# Patient Record
Sex: Female | Born: 1955 | Race: White | Hispanic: No | Marital: Married | Smoking: Former smoker
Health system: Southern US, Community
[De-identification: ages and names within clinical notes are randomized; demographics above are authoritative.]

## PROBLEM LIST (undated history)

## (undated) DIAGNOSIS — Z8639 Personal history of other endocrine, nutritional and metabolic disease: Secondary | ICD-10-CM

## (undated) DIAGNOSIS — F419 Anxiety disorder, unspecified: Secondary | ICD-10-CM

## (undated) DIAGNOSIS — E785 Hyperlipidemia, unspecified: Secondary | ICD-10-CM

## (undated) DIAGNOSIS — Z8614 Personal history of Methicillin resistant Staphylococcus aureus infection: Secondary | ICD-10-CM

## (undated) DIAGNOSIS — K219 Gastro-esophageal reflux disease without esophagitis: Secondary | ICD-10-CM

## (undated) DIAGNOSIS — R911 Solitary pulmonary nodule: Secondary | ICD-10-CM

## (undated) DIAGNOSIS — M1990 Unspecified osteoarthritis, unspecified site: Secondary | ICD-10-CM

## (undated) DIAGNOSIS — K449 Diaphragmatic hernia without obstruction or gangrene: Secondary | ICD-10-CM

## (undated) DIAGNOSIS — K8020 Calculus of gallbladder without cholecystitis without obstruction: Secondary | ICD-10-CM

## (undated) HISTORY — DX: Gastro-esophageal reflux disease without esophagitis: K21.9

## (undated) HISTORY — PX: UPPER GASTROINTESTINAL ENDOSCOPY: SHX188

## (undated) HISTORY — DX: Hyperlipidemia, unspecified: E78.5

## (undated) HISTORY — DX: Solitary pulmonary nodule: R91.1

## (undated) HISTORY — PX: TONSILLECTOMY: SUR1361

## (undated) HISTORY — DX: Anxiety disorder, unspecified: F41.9

## (undated) HISTORY — DX: Calculus of gallbladder without cholecystitis without obstruction: K80.20

## (undated) HISTORY — PX: COLONOSCOPY: SHX174

## (undated) HISTORY — DX: Personal history of Methicillin resistant Staphylococcus aureus infection: Z86.14

## (undated) HISTORY — DX: Personal history of other endocrine, nutritional and metabolic disease: Z86.39

## (undated) HISTORY — DX: Diaphragmatic hernia without obstruction or gangrene: K44.9

## (undated) HISTORY — DX: Unspecified osteoarthritis, unspecified site: M19.90

---

## 1992-08-17 HISTORY — PX: BREAST EXCISIONAL BIOPSY: SUR124

## 2005-08-17 DIAGNOSIS — Z8614 Personal history of Methicillin resistant Staphylococcus aureus infection: Secondary | ICD-10-CM

## 2005-08-17 HISTORY — DX: Personal history of Methicillin resistant Staphylococcus aureus infection: Z86.14

## 2008-08-17 DIAGNOSIS — Z8639 Personal history of other endocrine, nutritional and metabolic disease: Secondary | ICD-10-CM

## 2008-08-17 DIAGNOSIS — K449 Diaphragmatic hernia without obstruction or gangrene: Secondary | ICD-10-CM

## 2008-08-17 HISTORY — DX: Diaphragmatic hernia without obstruction or gangrene: K44.9

## 2008-08-17 HISTORY — DX: Personal history of other endocrine, nutritional and metabolic disease: Z86.39

## 2012-10-03 ENCOUNTER — Encounter: Payer: Self-pay | Admitting: Family Medicine

## 2012-10-03 ENCOUNTER — Ambulatory Visit (INDEPENDENT_AMBULATORY_CARE_PROVIDER_SITE_OTHER): Payer: BC Managed Care – PPO | Admitting: Family Medicine

## 2012-10-03 VITALS — BP 140/90 | HR 76 | Temp 97.9°F | Ht 66.0 in | Wt 207.0 lb

## 2012-10-03 DIAGNOSIS — R911 Solitary pulmonary nodule: Secondary | ICD-10-CM

## 2012-10-03 DIAGNOSIS — E559 Vitamin D deficiency, unspecified: Secondary | ICD-10-CM

## 2012-10-03 DIAGNOSIS — K8020 Calculus of gallbladder without cholecystitis without obstruction: Secondary | ICD-10-CM | POA: Insufficient documentation

## 2012-10-03 DIAGNOSIS — L989 Disorder of the skin and subcutaneous tissue, unspecified: Secondary | ICD-10-CM

## 2012-10-03 DIAGNOSIS — F411 Generalized anxiety disorder: Secondary | ICD-10-CM

## 2012-10-03 DIAGNOSIS — B351 Tinea unguium: Secondary | ICD-10-CM

## 2012-10-03 DIAGNOSIS — F419 Anxiety disorder, unspecified: Secondary | ICD-10-CM

## 2012-10-03 DIAGNOSIS — K219 Gastro-esophageal reflux disease without esophagitis: Secondary | ICD-10-CM

## 2012-10-03 LAB — COMPREHENSIVE METABOLIC PANEL
ALT: 24 U/L (ref 0–35)
AST: 21 U/L (ref 0–37)
Alkaline Phosphatase: 40 U/L (ref 39–117)
Calcium: 9.2 mg/dL (ref 8.4–10.5)
Chloride: 104 mEq/L (ref 96–112)
Creatinine, Ser: 0.9 mg/dL (ref 0.4–1.2)
Total Bilirubin: 0.3 mg/dL (ref 0.3–1.2)

## 2012-10-03 MED ORDER — TERBINAFINE HCL 250 MG PO TABS: ORAL_TABLET | ORAL | Status: DC

## 2012-10-03 NOTE — Patient Instructions (Addendum)
Nice to meet you. Please stop by to see Shirlee Limerick on your way out to set up your dermatology and pulmonary appointments.  Please come see me in May for your complete physical.  Please start lamisil 250 mg tab- tablet daily for 6 weeks.  Please call in 6 weeks with an update.  We will call you with your lab results from today.

## 2012-10-03 NOTE — Progress Notes (Signed)
Subjective:    Patient ID: Monica Carrillo, female    DOB: 04/09/1956, 57 y.o.   MRN: [id number]  HPI  Very pleasant 57 yo female here to establish care.  Recently relocated here from [place] to be closer to her son and 2 grandchildren.  Husband retired.  GERD- on Nexium 40 mg daily.  Brings in previous records.  In 2008- UGI series showed GERD with hiatal hernia. Feels her symptoms are pretty well controlled with Nexium.  Toe nail fungus- Right great toe- discoloration and cracking for months.  Failed topical antifungals.  Has had this on prior toe in past.  Required oral antifungals.  Skin lesion- raised pink lesion on left cheek.  Been there for months.  Not sure if has grown.  Has not yet established with dermatologist here.  Anxiety- has been quite well controlled on Cymbalta 60 mg daily, even with recent move.  Denies any anxiety or depression.  Pulmonary nodules- incidental findings on CXR in 2010.  Had serial follow up Chest CTs to ensure stability.  Per pt, pulmonologist in [place] advised follow up with pulmonologist (not CT scan necessarily) in 05/2013.  Had last CPX in 12/2011.  UTD on prevention.  Patient Active Problem List  Diagnosis  . Anxiety  . Gallstones  . GERD (gastroesophageal reflux disease)  . Unspecified vitamin D deficiency  . Lung nodule   Past Medical History  Diagnosis Date  . Anxiety   . Gallstones   . GERD (gastroesophageal reflux disease)   . History of thyroid nodule 2010  . History of MRSA infection 2007  . Hiatal hernia 2010  . Lung nodule     needs follow up in 05/2013   Past Surgical History  Procedure Laterality Date  . Cesarean section    . Tonsillectomy     History  Substance Use Topics  . Smoking status: Former Games developer  . Smokeless tobacco: Not on file  . Alcohol Use: Not on file   Family History  Problem Relation Age of Onset  . Hypertension Mother   . Kidney disease Brother    No Known Allergies No current  outpatient prescriptions on file prior to visit.   No current facility-administered medications on file prior to visit.   The PMH, PSH, Social History, Family History, Medications, and allergies have been reviewed in [facility], and have been updated if relevant.    Review of Systems    See HPI No CP or SOB Objective:   Physical Exam  Constitutional: She is oriented to person, place, and time. She appears well-developed and well-nourished. No distress.  HENT:  Head: Normocephalic and atraumatic.  Right Ear: External ear normal.  Eyes: Pupils are equal, round, and reactive to light.  Neck: Normal range of motion. Neck supple.  Cardiovascular: Normal rate, regular rhythm and intact distal pulses.   Pulmonary/Chest: Effort normal and breath sounds normal.  Abdominal: Soft.  Neurological: She is alert and oriented to person, place, and time.  Skin:     Psychiatric: She has a normal mood and affect. Her speech is normal and behavior is normal. Judgment and thought content normal. Cognition and memory are normal.      Assessment & Plan:  1. Skin lesion Possibly pre cancerous lesion.  She would like refer rather than cryo since she was following up yearly with dermatology in [place].  Referral placed. - Ambulatory referral to Dermatology  2. Unspecified vitamin D deficiency Has been taking high dose vitamin D for several months to  a year.  Recheck Vit D today. Can likely D/c this and start 800- 1000 IU of Vit D daily. - Vitamin D, 25-hydroxy  3. Nail fungal infection Failed topical antifungals.  Will start oral lamisil 250 mg daily x 6-12 weeks.  Check LFTs today. - Comprehensive metabolic panel  4. Lung nodule No growth in a non smoker.  She would like to get established with pulmonary since her previous pulmonary doctor recommended this.  Referral placed. - Ambulatory referral to Pulmonology  5. GERD (gastroesophageal reflux disease) Quiet on Nexium.  6. Anxiety Symptoms  stable on current dose of Cymbalta.

## 2012-10-04 LAB — VITAMIN D 25 HYDROXY (VIT D DEFICIENCY, FRACTURES): Vit D, 25-Hydroxy: 52 ng/mL (ref 30–89)

## 2012-10-07 ENCOUNTER — Encounter: Payer: Self-pay | Admitting: Family Medicine

## 2012-10-11 ENCOUNTER — Ambulatory Visit (INDEPENDENT_AMBULATORY_CARE_PROVIDER_SITE_OTHER): Payer: BC Managed Care – PPO | Admitting: Pulmonary Disease

## 2012-10-11 ENCOUNTER — Encounter: Payer: Self-pay | Admitting: Pulmonary Disease

## 2012-10-11 VITALS — BP 118/70 | HR 82 | Temp 98.1°F | Ht 66.0 in | Wt 208.0 lb

## 2012-10-11 DIAGNOSIS — R911 Solitary pulmonary nodule: Secondary | ICD-10-CM

## 2012-10-11 NOTE — Assessment & Plan Note (Signed)
Monica Carrillo has had scarring in her right middle lobe noticed since July 2010. I am able to see the images of the CT scan performed in October and July of 2013 but I am unable to see the official reports. The right middle lobe lesion did not change during that interval and she tells me that her previous pulmonologist did not see interval change from 2012 3 2013.  I think that it is very likely that the right middle lobe lesion represents scarring from an old infection as this is a common site for such problems. She has no personal risk factors for lung cancer and was lack of growth in this lesion is reassuring.  Plan: -I agree with her previous pulmonologist that we should repeat a CT scan of her chest without contrast in October 2014. If no change noted then she does not need further imaging.

## 2012-10-11 NOTE — Progress Notes (Signed)
Subjective:    Patient ID: Monica Carrillo, female    DOB: 10/10/1955, 57 y.o.   MRN: [id number]  HPI  This is a very pleasant 57 year old female who just relocated to [facility] who comes to our clinic today for evaluation of a pulmonary nodule. She smoked very briefly as a teenager on weekends only and has never had any respiratory illnesses. In 2010 she underwent a DEXA scan and was noted to have a right middle lobe nodule. Her primary care physician followed this nodule with serial CT scans and after 2 years there was slight growth and so she was referred to a pulmonologist. The pulmonologist felt that this lesion represented an old infection and treated her with 2 weeks of Augmentin. He then followed the lesion for 2 years with q6 month CT scans. According to her the lesion did not grow during that time and he instructed her that a repeat CT scan should be performed in October 2014. He planned to stop the serial CTs if the October 2014 lesion was unchanged. Of note, a July 2013 CT scan showed a small, subcentimeter nodule in the lingula. This lesion was not present on the October 2013 study. She tells me that she has no respiratory symptoms. She has no cough, shortness of breath, or chest tightness. She does not have a personal history of malignancy. She has no first-degree relatives with lung cancer. She does not recall ever being diagnosed with pneumonia.   Past Medical History  Diagnosis Date  . Anxiety   . Gallstones   . GERD (gastroesophageal reflux disease)   . History of thyroid nodule 2010  . History of MRSA infection 2007  . Hiatal hernia 2010  . Lung nodule     needs follow up in 05/2013     Family History  Problem Relation Age of Onset  . Hypertension Mother   . Kidney disease Brother      History   Social History  . Marital Status: Married    Spouse Name: N/A    Number of Children: N/A  . Years of Education: N/A   Occupational History  . Not on  file.   Social History Main Topics  . Smoking status: Never Smoker   . Smokeless tobacco: Not on file  . Alcohol Use: Not on file  . Drug Use: Not on file  . Sexually Active: Not on file   Other Topics Concern  . Not on file   Social History Narrative   Married.  Two children, 5 grandchildren.   Son, daughter in law and two grand daughters live in [place].      Recently moved to [place] from [place].   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     No Known Allergies   Outpatient Prescriptions Prior to Visit  Medication Sig Dispense Refill  . dicyclomine (BENTYL) 10 MG capsule Take 10 mg by mouth 4 (four) times daily -  before meals and at bedtime.      . DULoxetine (CYMBALTA) 60 MG capsule Take 60 mg by mouth daily.      . ergocalciferol (VITAMIN D2) 50000 UNITS capsule Take 50,000 Units by mouth once a week.      . esomeprazole (NEXIUM) 40 MG capsule Take 40 mg by mouth daily before breakfast.      . terbinafine (LAMISIL) 250 MG tablet 1 tablet once daily for 6 weeks.  30 tablet  6   No facility-administered medications prior  to visit.      Review of Systems  Constitutional: Negative for fever and unexpected weight change.  HENT: Negative for ear pain, nosebleeds, congestion, sore throat, rhinorrhea, sneezing, trouble swallowing, dental problem, postnasal drip and sinus pressure.   Eyes: Negative for redness and itching.  Respiratory: Positive for cough. Negative for chest tightness, shortness of breath and wheezing.   Cardiovascular: Negative for palpitations and leg swelling.  Gastrointestinal: Negative for nausea and vomiting.  Genitourinary: Negative for dysuria.  Musculoskeletal: Negative for joint swelling.  Skin: Negative for rash.  Neurological: Negative for headaches.  Hematological: Does not bruise/bleed easily.  Psychiatric/Behavioral: Positive for dysphoric mood. The patient is nervous/anxious.        Objective:   Physical  Exam  Filed Vitals:   10/11/12 1118  BP: 118/70  Pulse: 82  Temp: 98.1 F (36.7 C)  TempSrc: Oral  Height: 5\' 6"  (1.676 m)  Weight: 208 lb (94.348 kg)  SpO2: 97%   Gen: well appearing, no acute distress HEENT: NCAT, PERRL, EOMi, OP clear, neck supple without masses PULM: CTA B CV: RRR, no mgr, no JVD AB: BS+, soft, nontender, no hsm Ext: warm, no edema, no clubbing, no cyanosis Derm: no rash or skin breakdown Neuro: A&Ox4, CN II-XII intact, strength 5/5 in all 4 extremities  October 2013 CT chest personally reviewed by me (McQuaid)>> there is a triangular area of consolidation in the right middle lobe abutting the right heart border. It is approximately one and a half to 2 cm deep. There are no other airway, mediastinal, or parenchymal abnormalities compared to the July 2013 study the lesion has not changed. A July 2013 nodule in the lingula is no longer present.      Assessment & Plan:   Lung nodule Ms. Fass has had scarring in her right middle lobe noticed since July 2010. I am able to see the images of the CT scan performed in October and July of 2013 but I am unable to see the official reports. The right middle lobe lesion did not change during that interval and she tells me that her previous pulmonologist did not see interval change from 2012 3 2013.  I think that it is very likely that the right middle lobe lesion represents scarring from an old infection as this is a common site for such problems. She has no personal risk factors for lung cancer and was lack of growth in this lesion is reassuring.  Plan: -I agree with her previous pulmonologist that we should repeat a CT scan of her chest without contrast in October 2014. If no change noted then she does not need further imaging.   Updated Medication List Outpatient Encounter Prescriptions as of 10/11/2012  Medication Sig Dispense Refill  . dicyclomine (BENTYL) 10 MG capsule Take 10 mg by mouth 4 (four) times daily -   before meals and at bedtime.      . DULoxetine (CYMBALTA) 60 MG capsule Take 60 mg by mouth daily.      . ergocalciferol (VITAMIN D2) 50000 UNITS capsule Take 50,000 Units by mouth once a week.      . esomeprazole (NEXIUM) 40 MG capsule Take 40 mg by mouth daily before breakfast.      . terbinafine (LAMISIL) 250 MG tablet 1 tablet once daily for 6 weeks.  30 tablet  6   No facility-administered encounter medications on file as of 10/11/2012.

## 2012-10-11 NOTE — Patient Instructions (Signed)
We will set up a CT scan of your Chest in October 2014 at [facility].  If you have not heard from our office about this by late September call us.  We will see you after the CT in October.

## 2012-11-03 ENCOUNTER — Telehealth: Payer: Self-pay

## 2012-11-03 NOTE — Telephone Encounter (Signed)
Pt left v/m has samples of Cymbalta thru 11/05/12. Pt request more samples. Call when ready for pick up.

## 2012-11-03 NOTE — Telephone Encounter (Signed)
Ok to give samples

## 2012-11-04 NOTE — Telephone Encounter (Signed)
5 sample boxes of #7 each given.  Lot number [id number], EXP 09/2013.  Advised patient, she will pick up.

## 2012-11-16 ENCOUNTER — Telehealth: Payer: Self-pay

## 2012-11-16 MED ORDER — VALACYCLOVIR HCL 1 G PO TABS: 1000.0000 mg | ORAL_TABLET | Freq: Two times a day (BID) | ORAL | Status: DC

## 2012-11-16 NOTE — Telephone Encounter (Signed)
6 sample boxes of # 5 each given.  Lot number [id number], exp 02/2015.    Advised patient ok to pick up.  [facility]

## 2012-11-16 NOTE — Telephone Encounter (Signed)
Pt said Nexium is helping and request samples of Nexium if available. Please advise. Pt request rx for Valacyclovir 1 gm # 30 with instructions One tab by mouth twice a day until fever blister gone. Max 5 days per outbreak. Was given by former physician in [place]. [facility].Please advise.

## 2012-11-16 NOTE — Telephone Encounter (Signed)
Ok to give samples and send rx for valtrex as pt requests below.

## 2012-12-23 ENCOUNTER — Other Ambulatory Visit: Payer: Self-pay | Admitting: Family Medicine

## 2012-12-23 DIAGNOSIS — E559 Vitamin D deficiency, unspecified: Secondary | ICD-10-CM

## 2012-12-23 DIAGNOSIS — Z0000 Encounter for general adult medical examination without abnormal findings: Secondary | ICD-10-CM

## 2012-12-23 DIAGNOSIS — Z136 Encounter for screening for cardiovascular disorders: Secondary | ICD-10-CM

## 2012-12-27 ENCOUNTER — Other Ambulatory Visit: Payer: BC Managed Care – PPO

## 2013-01-03 ENCOUNTER — Encounter: Payer: BC Managed Care – PPO | Admitting: Family Medicine

## 2013-01-11 ENCOUNTER — Other Ambulatory Visit (INDEPENDENT_AMBULATORY_CARE_PROVIDER_SITE_OTHER): Payer: BC Managed Care – PPO

## 2013-01-11 DIAGNOSIS — Z136 Encounter for screening for cardiovascular disorders: Secondary | ICD-10-CM

## 2013-01-11 DIAGNOSIS — Z0000 Encounter for general adult medical examination without abnormal findings: Secondary | ICD-10-CM

## 2013-01-11 DIAGNOSIS — E559 Vitamin D deficiency, unspecified: Secondary | ICD-10-CM

## 2013-01-11 LAB — CBC WITH DIFFERENTIAL/PLATELET
Basophils Absolute: 0 10*3/uL (ref 0.0–0.1)
Eosinophils Relative: 3.9 % (ref 0.0–5.0)
Lymphocytes Relative: 31.3 % (ref 12.0–46.0)
Monocytes Relative: 9.7 % (ref 3.0–12.0)
Platelets: 299 10*3/uL (ref 150.0–400.0)
RDW: 13.1 % (ref 11.5–14.6)
WBC: 4.9 10*3/uL (ref 4.5–10.5)

## 2013-01-11 LAB — COMPREHENSIVE METABOLIC PANEL
ALT: 29 U/L (ref 0–35)
AST: 23 U/L (ref 0–37)
Albumin: 3.9 g/dL (ref 3.5–5.2)
Alkaline Phosphatase: 36 U/L — ABNORMAL LOW (ref 39–117)
BUN: 17 mg/dL (ref 6–23)
Calcium: 9 mg/dL (ref 8.4–10.5)
Chloride: 105 mEq/L (ref 96–112)
Creatinine, Ser: 0.9 mg/dL (ref 0.4–1.2)
Potassium: 4.2 mEq/L (ref 3.5–5.1)

## 2013-01-11 LAB — LDL CHOLESTEROL, DIRECT: Direct LDL: 175.6 mg/dL

## 2013-01-11 LAB — LIPID PANEL
HDL: 53.9 mg/dL (ref >39.00)
Total CHOL/HDL Ratio: 5
Triglycerides: 126 mg/dL (ref 0.0–149.0)

## 2013-01-12 LAB — VITAMIN D 25 HYDROXY (VIT D DEFICIENCY, FRACTURES): Vit D, 25-Hydroxy: 81 ng/mL (ref 30–89)

## 2013-01-17 ENCOUNTER — Ambulatory Visit (INDEPENDENT_AMBULATORY_CARE_PROVIDER_SITE_OTHER): Payer: BC Managed Care – PPO | Admitting: Family Medicine

## 2013-01-17 ENCOUNTER — Encounter: Payer: Self-pay | Admitting: Family Medicine

## 2013-01-17 VITALS — BP 102/70 | HR 72 | Temp 97.9°F | Ht 66.0 in | Wt 209.0 lb

## 2013-01-17 DIAGNOSIS — F411 Generalized anxiety disorder: Secondary | ICD-10-CM

## 2013-01-17 DIAGNOSIS — E785 Hyperlipidemia, unspecified: Secondary | ICD-10-CM

## 2013-01-17 DIAGNOSIS — F419 Anxiety disorder, unspecified: Secondary | ICD-10-CM

## 2013-01-17 DIAGNOSIS — K219 Gastro-esophageal reflux disease without esophagitis: Secondary | ICD-10-CM

## 2013-01-17 DIAGNOSIS — E559 Vitamin D deficiency, unspecified: Secondary | ICD-10-CM

## 2013-01-17 DIAGNOSIS — R911 Solitary pulmonary nodule: Secondary | ICD-10-CM

## 2013-01-17 DIAGNOSIS — Z1211 Encounter for screening for malignant neoplasm of colon: Secondary | ICD-10-CM

## 2013-01-17 DIAGNOSIS — Z1231 Encounter for screening mammogram for malignant neoplasm of breast: Secondary | ICD-10-CM

## 2013-01-17 DIAGNOSIS — Z01419 Encounter for gynecological examination (general) (routine) without abnormal findings: Secondary | ICD-10-CM | POA: Insufficient documentation

## 2013-01-17 DIAGNOSIS — Z0000 Encounter for general adult medical examination without abnormal findings: Secondary | ICD-10-CM

## 2013-01-17 NOTE — Progress Notes (Signed)
Subjective:    Patient ID: Monica Carrillo, female    DOB: 1956-06-08, 57 y.o.   MRN: [id number]  HPI  Very pleasant 57 yo female here for CPX. Colonoscopy UTD.   No h/o abnormal pap smears.  Last pap smear done by previous PCP prior to moving here (last summer).  GERD- on Nexium 40 mg daily.  In 2008- UGI series showed GERD with hiatal hernia. Feels her symptoms are pretty well controlled with Nexium.  Anxiety- has been quite well controlled on Cymbalta 60 mg daily, even with recent move.  Denies any anxiety or depression.  Loves living in this area.    Pulmonary nodules- incidental findings on CXR in 2010.  Had serial follow up Chest CTs to ensure stability.  Referred to Dr. Kendrick Fries in 09/2012.  Scheduled for follow up CT this fall.  HLD- LDL elevated.  No family h/o HLD.  She does state she likes ice cream and butter. Lab Results  Component Value Date   CHOL 251* 01/11/2013   HDL 53.90 01/11/2013   LDLDIRECT 175.6 01/11/2013   TRIG 126.0 01/11/2013   CHOLHDL 5 01/11/2013   Lab Results  Component Value Date   NA 140 01/11/2013   K 4.2 01/11/2013   CL 105 01/11/2013   CO2 32 01/11/2013   Lab Results  Component Value Date   CREATININE 0.9 01/11/2013     Patient Active Problem List   Diagnosis Date Noted  . Routine general medical examination at a health care facility 01/17/2013  . Unspecified vitamin D deficiency 10/03/2012  . Anxiety   . Gallstones   . GERD (gastroesophageal reflux disease)   . Lung nodule    Past Medical History  Diagnosis Date  . Anxiety   . Gallstones   . GERD (gastroesophageal reflux disease)   . History of thyroid nodule 2010  . History of MRSA infection 2007  . Hiatal hernia 2010  . Lung nodule     needs follow up in 05/2013   Past Surgical History  Procedure Laterality Date  . Cesarean section    . Tonsillectomy     History  Substance Use Topics  . Smoking status: Never Smoker   . Smokeless tobacco: Not on file  . Alcohol Use: Not on  file   Family History  Problem Relation Age of Onset  . Hypertension Mother   . Kidney disease Brother    No Known Allergies Current Outpatient Prescriptions on File Prior to Visit  Medication Sig Dispense Refill  . dicyclomine (BENTYL) 10 MG capsule Take 10 mg by mouth 4 (four) times daily -  before meals and at bedtime.      . DULoxetine (CYMBALTA) 60 MG capsule Take 60 mg by mouth daily.      . ergocalciferol (VITAMIN D2) 50000 UNITS capsule Take 50,000 Units by mouth once a week.      . esomeprazole (NEXIUM) 40 MG capsule Take 40 mg by mouth daily before breakfast.      . terbinafine (LAMISIL) 250 MG tablet 1 tablet once daily for 6 weeks.  30 tablet  6  . valACYclovir (VALTREX) 1000 MG tablet Take 1 tablet (1,000 mg total) by mouth 2 (two) times daily. Until fever blister is gone.  Maximum of 5 days per outbreak.  30 tablet  0   No current facility-administered medications on file prior to visit.   The PMH, PSH, Social History, Family History, Medications, and allergies have been reviewed in [facility], and have been updated  if relevant.   ROS:  See HPI Patient reports no  vision/ hearing changes,anorexia, weight change, fever ,adenopathy, persistant / recurrent hoarseness, swallowing issues, chest pain, edema,persistant / recurrent cough, hemoptysis, dyspnea(rest, exertional, paroxysmal nocturnal), gastrointestinal  bleeding (melena, rectal bleeding), abdominal pain, excessive heart burn, GU symptoms(dysuria, hematuria, pyuria, voiding/incontinence  Issues) syncope, focal weakness, severe memory loss, concerning skin lesions, depression, anxiety, abnormal bruising/bleeding, major joint swelling, breast masses or abnormal vaginal bleeding.    Physical exam: BP 102/70  Pulse 72  Temp(Src) 97.9 F (36.6 C)  Ht 5\' 6"  (1.676 m)  Wt 209 lb (94.802 kg)  BMI 33.75 kg/m2  General:  Well-developed,well-nourished,in no acute distress; alert,appropriate and cooperative throughout  examination Head:  normocephalic and atraumatic.   Eyes:  vision grossly intact, pupils equal, pupils round, and pupils reactive to light.   Ears:  R ear normal and L ear normal.   Nose:  no external deformity.   Mouth:  good dentition.   Neck:  No deformities, masses, or tenderness noted. Breasts:  No mass, nodules, thickening, tenderness, bulging, retraction, inflamation, nipple discharge or skin changes noted.   Lungs:  Normal respiratory effort, chest expands symmetrically. Lungs are clear to auscultation, no crackles or wheezes. Heart:  Normal rate and regular rhythm. S1 and S2 normal without gallop, murmur, click, rub or other extra sounds. Abdomen:  Bowel sounds positive,abdomen soft and non-tender without masses, organomegaly or hernias noted. Msk:  No deformity or scoliosis noted of thoracic or lumbar spine.   Extremities:  No clubbing, cyanosis, edema, or deformity noted with normal full range of motion of all joints.   Neurologic:  alert & oriented X3 and gait normal.   Skin:  Intact without suspicious lesions or rashes Cervical Nodes:  No lymphadenopathy noted Axillary Nodes:  No palpable lymphadenopathy Psych:  Cognition and judgment appear intact. Alert and cooperative with normal attention span and concentration. No apparent delusions, illusions, hallucinations     Assessment & Plan:  1. Routine general medical examination at a health care facility Reviewed preventive care protocols, scheduled due services, and updated immunizations Discussed nutrition, exercise, diet, and healthy lifestyle.   2. Anxiety Stable.  3. GERD (gastroesophageal reflux disease) Stable on Nexium.  4. Lung nodule Stable.  Followed by pulm  5. Unspecified vitamin D deficiency Vit D looks great.  6. HLD (hyperlipidemia) New- she would like to try diet and exercise first.  Handout given.  Repeat lipid panel in 3 months.  7. Other screening mammogram  - MM Digital Screening; Future  8.  Special screening for malignant neoplasms, colon  - Fecal occult blood, imunochemical; Future

## 2013-01-17 NOTE — Patient Instructions (Addendum)
Good to see you.  Have a great summer! Please work on [employer] as discussed.  Come back in 3 months for lab work (call the day prior).  Please stop by the lab to pick up your stool cards.  Please stop by to see Shirlee Limerick on your way out to set up your mammogram.

## 2013-01-24 ENCOUNTER — Other Ambulatory Visit (INDEPENDENT_AMBULATORY_CARE_PROVIDER_SITE_OTHER): Payer: BC Managed Care – PPO

## 2013-01-24 DIAGNOSIS — Z1211 Encounter for screening for malignant neoplasm of colon: Secondary | ICD-10-CM

## 2013-01-24 LAB — FECAL OCCULT BLOOD, IMMUNOCHEMICAL: Fecal Occult Bld: NEGATIVE

## 2013-01-25 ENCOUNTER — Encounter: Payer: Self-pay | Admitting: Family Medicine

## 2013-01-26 ENCOUNTER — Encounter: Payer: Self-pay | Admitting: Family Medicine

## 2013-02-13 ENCOUNTER — Ambulatory Visit: Payer: Self-pay | Admitting: Family Medicine

## 2013-02-14 ENCOUNTER — Encounter: Payer: Self-pay | Admitting: Family Medicine

## 2013-02-15 ENCOUNTER — Encounter: Payer: Self-pay | Admitting: Family Medicine

## 2013-02-15 ENCOUNTER — Encounter: Payer: Self-pay | Admitting: *Deleted

## 2013-02-15 LAB — HM MAMMOGRAPHY: HM Mammogram: NORMAL

## 2013-03-03 ENCOUNTER — Telehealth: Payer: Self-pay | Admitting: *Deleted

## 2013-03-03 NOTE — Telephone Encounter (Signed)
Tdap

## 2013-03-03 NOTE — Telephone Encounter (Signed)
Pt was seen for a physical last month and has received a my chart message stating she's due for a tetanus.  appt scheduled for 7/22.  Should she get tdap or td?

## 2013-03-07 ENCOUNTER — Ambulatory Visit (INDEPENDENT_AMBULATORY_CARE_PROVIDER_SITE_OTHER): Payer: BC Managed Care – PPO | Admitting: *Deleted

## 2013-03-07 DIAGNOSIS — Z23 Encounter for immunization: Secondary | ICD-10-CM

## 2013-04-27 ENCOUNTER — Other Ambulatory Visit: Payer: Self-pay | Admitting: Family Medicine

## 2013-04-27 DIAGNOSIS — E785 Hyperlipidemia, unspecified: Secondary | ICD-10-CM

## 2013-05-02 ENCOUNTER — Other Ambulatory Visit: Payer: BC Managed Care – PPO

## 2013-05-05 ENCOUNTER — Other Ambulatory Visit (INDEPENDENT_AMBULATORY_CARE_PROVIDER_SITE_OTHER): Payer: BC Managed Care – PPO

## 2013-05-05 ENCOUNTER — Encounter: Payer: Self-pay | Admitting: Family Medicine

## 2013-05-05 DIAGNOSIS — E785 Hyperlipidemia, unspecified: Secondary | ICD-10-CM

## 2013-05-05 LAB — LIPID PANEL
Cholesterol: 204 mg/dL — ABNORMAL HIGH (ref 0–200)
Triglycerides: 84 mg/dL (ref 0.0–149.0)

## 2013-05-05 LAB — LDL CHOLESTEROL, DIRECT: Direct LDL: 134.8 mg/dL

## 2013-05-09 ENCOUNTER — Other Ambulatory Visit: Payer: BC Managed Care – PPO

## 2013-05-31 ENCOUNTER — Ambulatory Visit: Payer: Self-pay | Admitting: Pulmonary Disease

## 2013-06-06 ENCOUNTER — Telehealth: Payer: Self-pay | Admitting: *Deleted

## 2013-06-06 ENCOUNTER — Encounter: Payer: Self-pay | Admitting: Pulmonary Disease

## 2013-06-06 NOTE — Telephone Encounter (Signed)
Patient returned call.  Spoke with patient and advised of CT results / recs as stated by BQ below.  Patient verbalized her understanding, but does have 2 questions:  1. Per last ov, pt stated that it was agreed that if this CT showed no change, then she would be cleared from this standpoint? 2. Patient has upcoming appt on 11.3.14 with BQ, and she would like to know if she needs to keep this appt?  Per 2.25.14 ov: Plan:  -I agree with her previous pulmonologist that we should repeat a CT scan of her chest without contrast in October 2014. If no change noted then she does not need further imaging.  Dr Kendrick Fries please advise, thanks!

## 2013-06-06 NOTE — Telephone Encounter (Signed)
lmtcb

## 2013-06-06 NOTE — Telephone Encounter (Signed)
LMTCB

## 2013-06-06 NOTE — Telephone Encounter (Signed)
Message copied by Caryl Ada on Tue Jun 06, 2013  9:10 AM ------      Message from: Max Fickle B      Created: Tue Jun 06, 2013  9:09 AM       L,            Please let her know that her CT chest looked OK.  The nodules had not changed.            We need to get another CT in 6 months to follow them.            We will discuss further on her next visit.            Thanks      B ------

## 2013-06-06 NOTE — Telephone Encounter (Signed)
Well, she really should see [place] because she had some small incidental changes that I want to discuss with her on the next visit.

## 2013-06-07 NOTE — Telephone Encounter (Signed)
Pt is aware to keep her upcoming appointment next month with BQ.

## 2013-06-19 ENCOUNTER — Encounter: Payer: Self-pay | Admitting: Pulmonary Disease

## 2013-06-19 ENCOUNTER — Ambulatory Visit (INDEPENDENT_AMBULATORY_CARE_PROVIDER_SITE_OTHER): Payer: BC Managed Care – PPO | Admitting: Pulmonary Disease

## 2013-06-19 VITALS — BP 116/82 | HR 66 | Ht 66.0 in | Wt 207.0 lb

## 2013-06-19 DIAGNOSIS — R911 Solitary pulmonary nodule: Secondary | ICD-10-CM

## 2013-06-19 NOTE — Progress Notes (Signed)
  Subjective:    Patient ID: Monica Carrillo, female    DOB: 1956-03-17, 57 y.o.   MRN: [id number]  Synopsis: Monica Carrillo has been followed by the [facility] pulmonary clinic since 2014 for a pulmonary nodule. She recently relocated to [facility] and it previously been [place] by [place] pulmonologist [place]. Pulmonary nodules were noted in the right upper lobe and right lower lobe in July 2013 in number followed with serial CT scans.  HPI  06/19/2013 routine office visit>> Monica Carrillo has been doing well since the last visit. She does not have any shortness of breath, chest pain, hemoptysis, or weight loss. She denies any significant respiratory symptoms.  Past Medical History  Diagnosis Date  . Anxiety   . Gallstones   . GERD (gastroesophageal reflux disease)   . History of thyroid nodule 2010  . History of MRSA infection 2007  . Hiatal hernia 2010  . Lung nodule     needs follow up in 05/2013     Review of Systems     Objective:   Physical Exam  Filed Vitals:   06/19/13 1348  BP: 116/82  Pulse: 66   Gen: well appearing, no acute distress HEENT: NCAT,  EOMi, OP clear,  PULM: CTA B CV: RRR, no mgr, no JVD AB: BS+, soft,  Ext: warm, no edema, no clubbing, no cyanosis  October 2014 CT chest reviewed. The right middle lobe nodules are unchanged and perhaps smaller compared to the 2013 images she has provided me in clinic. There is a right upper lobe nodule no bigger than 5 mm which I cannot clearly see on the November 2013 study, but it does appear to be present on the July 2013 study. It appears to be stable compared to the July 2013 study.     Assessment & Plan:   Lung nodule I am encouraged by the fact that the changes in the right middle lobe are stable. She does have a very small right upper lobe nodule which seems to be unchanged compared to the July 2013 study she shows me in clinic today. (She had hard copies of her prior CTs with her).  Plan: -Repeat  CT chest July 2015 to follow the right upper lobe nodule that test should be 2 years stability, we can call her with the results and she does not need to followup if it is normal.   Updated Medication List Outpatient Encounter Prescriptions as of 06/19/2013  Medication Sig  . dicyclomine (BENTYL) 10 MG capsule Take 10 mg by mouth 4 (four) times daily -  before meals and at bedtime.  . DULoxetine (CYMBALTA) 60 MG capsule Take 60 mg by mouth daily.  Marland Kitchen esomeprazole (NEXIUM) 40 MG capsule Take 40 mg by mouth daily before breakfast.  . valACYclovir (VALTREX) 1000 MG tablet Take 1 tablet (1,000 mg total) by mouth 2 (two) times daily. Until fever blister is gone.  Maximum of 5 days per outbreak.     w

## 2013-06-19 NOTE — Patient Instructions (Signed)
We will order a repeat CT chest for July 2015 for your pulmonary nodule at [facility]

## 2013-06-19 NOTE — Assessment & Plan Note (Signed)
I am encouraged by the fact that the changes in the right middle lobe are stable. She does have a very small right upper lobe nodule which seems to be unchanged compared to the July 2013 study she shows me in clinic today. (She had hard copies of her prior CTs with her).  Plan: -Repeat CT chest July 2015 to follow the right upper lobe nodule that test should be 2 years stability, we can call her with the results and she does not need to followup if it is normal.

## 2013-07-05 ENCOUNTER — Encounter: Payer: Self-pay | Admitting: Pulmonary Disease

## 2013-09-12 ENCOUNTER — Ambulatory Visit (INDEPENDENT_AMBULATORY_CARE_PROVIDER_SITE_OTHER): Payer: BC Managed Care – PPO | Admitting: Family Medicine

## 2013-09-12 VITALS — BP 118/78 | HR 86 | Temp 98.7°F | Ht 66.0 in | Wt 206.5 lb

## 2013-09-12 DIAGNOSIS — J069 Acute upper respiratory infection, unspecified: Secondary | ICD-10-CM

## 2013-09-12 NOTE — Progress Notes (Signed)
Pre-visit discussion using our clinic review tool. No additional management support is needed unless otherwise documented below in the visit note.  

## 2013-09-12 NOTE — Progress Notes (Signed)
SUBJECTIVE:  Monica Carrillo is a 58 y.o. female who complains of coryza, congestion, sneezing, sore throat and swollen glands for  7  days. She denies a history of anorexia, chest pain, chills, dizziness, fatigue and fevers and denies a history of asthma. Patient denies smoke cigarettes.  She is feeling better but symptoms are lingering.  Patient Active Problem List   Diagnosis Date Noted  . Routine general medical examination at a health care facility 01/17/2013  . HLD (hyperlipidemia) 01/17/2013  . Unspecified vitamin D deficiency 10/03/2012  . Anxiety   . Gallstones   . GERD (gastroesophageal reflux disease)   . Lung nodule    Past Medical History  Diagnosis Date  . Anxiety   . Gallstones   . GERD (gastroesophageal reflux disease)   . History of thyroid nodule 2010  . History of MRSA infection 2007  . Hiatal hernia 2010  . Lung nodule     needs follow up in 05/2013   Past Surgical History  Procedure Laterality Date  . Cesarean section    . Tonsillectomy     History  Substance Use Topics  . Smoking status: Never Smoker   . Smokeless tobacco: Not on file  . Alcohol Use: Not on file   Family History  Problem Relation Age of Onset  . Hypertension Mother   . Kidney disease Brother    No Known Allergies Current Outpatient Prescriptions on File Prior to Visit  Medication Sig Dispense Refill  . dicyclomine (BENTYL) 10 MG capsule Take 10 mg by mouth 4 (four) times daily -  before meals and at bedtime.      . DULoxetine (CYMBALTA) 60 MG capsule Take 60 mg by mouth daily.      Marland Kitchen esomeprazole (NEXIUM) 40 MG capsule Take 40 mg by mouth daily before breakfast.      . valACYclovir (VALTREX) 1000 MG tablet Take 1 tablet (1,000 mg total) by mouth 2 (two) times daily. Until fever blister is gone.  Maximum of 5 days per outbreak.  30 tablet  0   No current facility-administered medications on file prior to visit.   The PMH, PSH, Social History, Family History, Medications, and  allergies have been reviewed in [facility], and have been updated if relevant.    OBJECTIVE: BP 118/78  Pulse 86  Temp(Src) 98.7 F (37.1 C) (Oral)  Ht 5\' 6"  (1.676 m)  Wt 206 lb 8 oz (93.668 kg)  BMI 33.35 kg/m2  SpO2 98%  She appears well, vital signs are as noted. Ears normal.  Throat and pharynx normal.  Neck supple. No adenopathy in the neck. Nose is congested. Sinuses non tender. The chest is clear, without wheezes or rales.  ASSESSMENT:  viral upper respiratory illness  PLAN: Symptomatic therapy suggested: push fluids, rest and return office visit prn if symptoms persist or worsen. Lack of antibiotic effectiveness discussed with her. Call or return to clinic prn if these symptoms worsen or fail to improve as anticipated.

## 2013-11-23 ENCOUNTER — Other Ambulatory Visit: Payer: Self-pay | Admitting: Family Medicine

## 2014-03-02 ENCOUNTER — Other Ambulatory Visit: Payer: Self-pay | Admitting: Family Medicine

## 2014-04-01 ENCOUNTER — Other Ambulatory Visit: Payer: Self-pay | Admitting: Family Medicine

## 2014-04-06 ENCOUNTER — Encounter: Payer: Self-pay | Admitting: Family Medicine

## 2014-04-06 ENCOUNTER — Ambulatory Visit (INDEPENDENT_AMBULATORY_CARE_PROVIDER_SITE_OTHER): Payer: BC Managed Care – PPO | Admitting: Family Medicine

## 2014-04-06 VITALS — BP 122/72 | HR 71 | Temp 98.0°F | Ht 65.5 in | Wt 212.5 lb

## 2014-04-06 DIAGNOSIS — Z0000 Encounter for general adult medical examination without abnormal findings: Secondary | ICD-10-CM

## 2014-04-06 DIAGNOSIS — Z23 Encounter for immunization: Secondary | ICD-10-CM

## 2014-04-06 DIAGNOSIS — F419 Anxiety disorder, unspecified: Secondary | ICD-10-CM

## 2014-04-06 DIAGNOSIS — F411 Generalized anxiety disorder: Secondary | ICD-10-CM

## 2014-04-06 DIAGNOSIS — E785 Hyperlipidemia, unspecified: Secondary | ICD-10-CM

## 2014-04-06 DIAGNOSIS — R911 Solitary pulmonary nodule: Secondary | ICD-10-CM

## 2014-04-06 MED ORDER — DICYCLOMINE HCL 10 MG PO CAPS: ORAL_CAPSULE | ORAL | Status: DC

## 2014-04-06 NOTE — Progress Notes (Signed)
Subjective:    Patient ID: Monica Carrillo, female    DOB: Oct 07, 1955, 58 y.o.   MRN: [id number]  HPI  Very pleasant 58 yo female here for follow up.  GERD- on Nexium 40 mg daily.  In 2008- UGI series showed GERD with hiatal hernia. Feels her symptoms are pretty well controlled with Nexium.  Anxiety- has been quite well controlled on Cymbalta 60 mg daily.  Denies any anxiety or depression.  Pulmonary nodules- incidental findings on CXR in 2010.  Had serial follow up Chest CTs to ensure stability.  Referred to Dr. Lake Bells in 09/2012.  Last CT was 05/31/2013- reviewed in Epic.  HLD- No family h/o HLD.  Trying to control with diet.  Much improvement between May and September 2014.  She is walking a lot- got a puppy named Toby. Lab Results  Component Value Date   CHOL 204* 05/05/2013   HDL 47.80 05/05/2013   LDLDIRECT 134.8 05/05/2013   TRIG 84.0 05/05/2013   CHOLHDL 4 05/05/2013   Lab Results  Component Value Date   NA 140 01/11/2013   K 4.2 01/11/2013   CL 105 01/11/2013   CO2 32 01/11/2013   Lab Results  Component Value Date   CREATININE 0.9 01/11/2013     Patient Active Problem List   Diagnosis Date Noted  . Routine general medical examination at a health care facility 01/17/2013  . HLD (hyperlipidemia) 01/17/2013  . Unspecified vitamin D deficiency 10/03/2012  . Anxiety   . Gallstones   . GERD (gastroesophageal reflux disease)   . Lung nodule    Past Medical History  Diagnosis Date  . Anxiety   . Gallstones   . GERD (gastroesophageal reflux disease)   . History of thyroid nodule 2010  . History of MRSA infection 2007  . Hiatal hernia 2010  . Lung nodule     needs follow up in 05/2013   Past Surgical History  Procedure Laterality Date  . Cesarean section    . Tonsillectomy     History  Substance Use Topics  . Smoking status: Never Smoker   . Smokeless tobacco: Not on file  . Alcohol Use: Not on file   Family History  Problem Relation Age of Onset  .  Hypertension Mother   . Kidney disease Brother    No Known Allergies Current Outpatient Prescriptions on File Prior to Visit  Medication Sig Dispense Refill  . dextromethorphan-guaiFENesin (MUCINEX DM) 30-600 MG per 12 hr tablet Take 1 tablet by mouth 2 (two) times daily.      Marland Kitchen dicyclomine (BENTYL) 10 MG capsule TAKE 1 CAPSULE BY MOUTH 4 TIMES A DAY  120 capsule  0  . DULoxetine (CYMBALTA) 60 MG capsule TAKE ONE CAPSULE BY MOUTH EVERY DAY  90 capsule  2  . esomeprazole (NEXIUM) 40 MG capsule Take 40 mg by mouth daily before breakfast.      . GLUCOSAMINE-CHONDROITIN PO Take by mouth daily.      . valACYclovir (VALTREX) 1000 MG tablet Take 1 tablet (1,000 mg total) by mouth 2 (two) times daily. Until fever blister is gone.  Maximum of 5 days per outbreak.  30 tablet  0   No current facility-administered medications on file prior to visit.   The PMH, PSH, Social History, Family History, Medications, and allergies have been reviewed in [facility], and have been updated if relevant.   ROS:  See HPI  No CP or SOB No Le edema Denies anxiety or depression No insomnia  Physical  exam: BP 122/72  Pulse 71  Temp(Src) 98 F (36.7 C) (Oral)  Ht 5' 5.5" (1.664 m)  Wt 212 lb 8 oz (96.389 kg)  BMI 34.81 kg/m2  SpO2 97%  Wt Readings from Last 3 Encounters:  04/06/14 212 lb 8 oz (96.389 kg)  09/12/13 206 lb 8 oz (93.668 kg)  06/19/13 207 lb (93.895 kg)     General:  Well-developed,well-nourished,in no acute distress; alert,appropriate and cooperative throughout examination Head:  normocephalic and atraumatic.   Eyes:  vision grossly intact, pupils equal, pupils round, and pupils reactive to light.   Ears:  R ear normal and L ear normal.   Nose:  no external deformity.   Mouth:  good dentition.    Lungs:  Normal respiratory effort, chest expands symmetrically. Lungs are clear to auscultation, no crackles or wheezes. Heart:  Normal rate and regular rhythm. S1 and S2 normal without gallop,  murmur, click, rub or other extra sounds. Abdomen:  Bowel sounds positive,abdomen soft and non-tender without masses, organomegaly or hernias noted. Msk:  No deformity or scoliosis noted of thoracic or lumbar spine.   Extremities:  No clubbing, cyanosis, edema, or deformity noted with normal full range of motion of all joints.   Neurologic:  alert & oriented X3 and gait normal.   Skin:  Intact without suspicious lesions or rashes Psych:  Cognition and judgment appear intact. Alert and cooperative with normal attention span and concentration. No apparent delusions, illusions, hallucinations     Assessment & Plan:

## 2014-04-06 NOTE — Assessment & Plan Note (Signed)
She made a real improvement with diet a lone. She is motivated to continue this. Recheck lipid panel this fall.

## 2014-04-06 NOTE — Patient Instructions (Signed)
Great to see you. Keep working on your diet and come see me this fall. Have a great time tonight.

## 2014-04-06 NOTE — Assessment & Plan Note (Signed)
Stable on current dose of cymbalta. No changes today.

## 2014-04-06 NOTE — Assessment & Plan Note (Signed)
See Dr. Anastasia Pall note. She is considering having a repeat CT this fall.

## 2014-06-19 ENCOUNTER — Encounter: Payer: Self-pay | Admitting: Family Medicine

## 2014-06-19 ENCOUNTER — Ambulatory Visit (INDEPENDENT_AMBULATORY_CARE_PROVIDER_SITE_OTHER): Payer: BC Managed Care – PPO | Admitting: Family Medicine

## 2014-06-19 ENCOUNTER — Other Ambulatory Visit (HOSPITAL_COMMUNITY)
Admission: RE | Admit: 2014-06-19 | Discharge: 2014-06-19 | Disposition: A | Discharge status: Home / Self Care | Payer: BC Managed Care – PPO | Source: Ambulatory Visit | Attending: Family Medicine | Admitting: Family Medicine

## 2014-06-19 VITALS — BP 116/66 | HR 78 | Temp 97.8°F | Ht 65.75 in | Wt 215.0 lb

## 2014-06-19 DIAGNOSIS — Z0000 Encounter for general adult medical examination without abnormal findings: Secondary | ICD-10-CM

## 2014-06-19 DIAGNOSIS — Z01419 Encounter for gynecological examination (general) (routine) without abnormal findings: Secondary | ICD-10-CM | POA: Diagnosis not present

## 2014-06-19 DIAGNOSIS — E785 Hyperlipidemia, unspecified: Secondary | ICD-10-CM

## 2014-06-19 DIAGNOSIS — E559 Vitamin D deficiency, unspecified: Secondary | ICD-10-CM

## 2014-06-19 DIAGNOSIS — L732 Hidradenitis suppurativa: Secondary | ICD-10-CM

## 2014-06-19 DIAGNOSIS — Z1151 Encounter for screening for human papillomavirus (HPV): Secondary | ICD-10-CM | POA: Diagnosis present

## 2014-06-19 DIAGNOSIS — Z1211 Encounter for screening for malignant neoplasm of colon: Secondary | ICD-10-CM

## 2014-06-19 DIAGNOSIS — K219 Gastro-esophageal reflux disease without esophagitis: Secondary | ICD-10-CM

## 2014-06-19 DIAGNOSIS — F419 Anxiety disorder, unspecified: Secondary | ICD-10-CM

## 2014-06-19 LAB — CBC WITH DIFFERENTIAL/PLATELET
BASOS PCT: 0.4 % (ref 0.0–3.0)
Basophils Absolute: 0 10*3/uL (ref 0.0–0.1)
EOS PCT: 2.9 % (ref 0.0–5.0)
Eosinophils Absolute: 0.2 10*3/uL (ref 0.0–0.7)
HEMATOCRIT: 42.6 % (ref 36.0–46.0)
Hemoglobin: 14.2 g/dL (ref 12.0–15.0)
Lymphocytes Relative: 24 % (ref 12.0–46.0)
Lymphs Abs: 1.2 10*3/uL (ref 0.7–4.0)
MCHC: 33.3 g/dL (ref 30.0–36.0)
MCV: 92.5 fl (ref 78.0–100.0)
MONOS PCT: 9.4 % (ref 3.0–12.0)
Monocytes Absolute: 0.5 10*3/uL (ref 0.1–1.0)
NEUTROS ABS: 3.3 10*3/uL (ref 1.4–7.7)
NEUTROS PCT: 63.3 % (ref 43.0–77.0)
PLATELETS: 265 10*3/uL (ref 150.0–400.0)
RBC: 4.6 Mil/uL (ref 3.87–5.11)
RDW: 13.7 % (ref 11.5–15.5)
WBC: 5.2 10*3/uL (ref 4.0–10.5)

## 2014-06-19 LAB — COMPREHENSIVE METABOLIC PANEL
ALT: 26 U/L (ref 0–35)
AST: 31 U/L (ref 0–37)
Albumin: 3.5 g/dL (ref 3.5–5.2)
Alkaline Phosphatase: 41 U/L (ref 39–117)
BUN: 12 mg/dL (ref 6–23)
CHLORIDE: 105 meq/L (ref 96–112)
CO2: 24 mEq/L (ref 19–32)
Calcium: 9 mg/dL (ref 8.4–10.5)
Creatinine, Ser: 0.9 mg/dL (ref 0.4–1.2)
GFR: 71.54 mL/min (ref >60.00)
GLUCOSE: 79 mg/dL (ref 70–99)
Potassium: 4 mEq/L (ref 3.5–5.1)
Sodium: 139 mEq/L (ref 135–145)
Total Bilirubin: 0.6 mg/dL (ref 0.2–1.2)
Total Protein: 6.9 g/dL (ref 6.0–8.3)

## 2014-06-19 LAB — LIPID PANEL
Cholesterol: 229 mg/dL — ABNORMAL HIGH (ref 0–200)
HDL: 58.6 mg/dL (ref >39.00)
LDL Cholesterol: 153 mg/dL — ABNORMAL HIGH (ref 0–99)
NONHDL: 170.4
Total CHOL/HDL Ratio: 4
Triglycerides: 88 mg/dL (ref 0.0–149.0)
VLDL: 17.6 mg/dL (ref 0.0–40.0)

## 2014-06-19 LAB — TSH: TSH: 1.44 u[IU]/mL (ref 0.35–4.50)

## 2014-06-19 MED ORDER — RETAPAMULIN 1 % EX OINT: TOPICAL_OINTMENT | Freq: Two times a day (BID) | CUTANEOUS | Status: DC

## 2014-06-19 NOTE — Progress Notes (Signed)
Pre visit review using our clinic review tool, if applicable. No additional management support is needed unless otherwise documented below in the visit note. 

## 2014-06-19 NOTE — Addendum Note (Signed)
Addended by: Modena Nunnery on: 06/19/2014 09:12 AM   Modules accepted: Orders

## 2014-06-19 NOTE — Assessment & Plan Note (Signed)
Due for labs today- orders entered.

## 2014-06-19 NOTE — Assessment & Plan Note (Signed)
Well controlled with prn altabax- rx refilled today.

## 2014-06-19 NOTE — Assessment & Plan Note (Signed)
Well controlled on current dose of Cymbalta. No changes made.

## 2014-06-19 NOTE — Patient Instructions (Signed)
Great to see you. We will call you with your lab results. Happy Holidays!

## 2014-06-19 NOTE — Assessment & Plan Note (Signed)
Reviewed preventive care protocols, scheduled due services, and updated immunizations Discussed nutrition, exercise, diet, and healthy lifestyle.  Mammogram scheduled. IFOB today. Has already received influenza vaccine.

## 2014-06-19 NOTE — Assessment & Plan Note (Signed)
Well controlled. No changes made to current rx.

## 2014-06-19 NOTE — Progress Notes (Signed)
Subjective:    Patient ID: Monica Carrillo, female    DOB: 01-Jul-1956, 58 y.o.   MRN: [id number]  HPI  Very pleasant 58 yo female here for CPX. Colonoscopy UTD- 2009 Neg IFOB 01/24/13 Denies changes in her bowel habits or blood in her stool. Mammogram 02/15/13- scheduled for next week. Influenza vaccine 04/06/14  No h/o abnormal pap smears.  Last pap smear done by previous PCP prior to moving here- ? 2- 3 years ago. LMP was in 10/2013.  Denies any hot flashes, insomnia, or vaginal dryness.  GERD- on Nexium 40 mg daily.  In 2008- UGI series showed GERD with hiatal hernia. Feels her symptoms are pretty well controlled with Nexium.  Anxiety- has been quite well controlled on Cymbalta 60 mg daily.  Denies any anxiety or depression.  Loves living in this area.    Hidradenitis- has h/o boils in her axilla and groin.  Seem to respond well to topical abx ointment if applied early. Denies any current boils.  HLD- LDL elevated when we initially checked in 12/2012 (LDL 172)- she wanted to work on diet and made dramatic improvement- LDL decreased to 134 in 04/2013. No family h/o HLD.  She has been walking more but admits to not following a low cholesterol diet recently.  Lab Results  Component Value Date   CHOL 204* 05/05/2013   HDL 47.80 05/05/2013   LDLDIRECT 134.8 05/05/2013   TRIG 84.0 05/05/2013   CHOLHDL 4 05/05/2013   Lab Results  Component Value Date   NA 140 01/11/2013   K 4.2 01/11/2013   CL 105 01/11/2013   CO2 32 01/11/2013   Lab Results  Component Value Date   CREATININE 0.9 01/11/2013   Lab Results  Component Value Date   WBC 4.9 01/11/2013   HGB 14.1 01/11/2013   HCT 41.1 01/11/2013   MCV 91.1 01/11/2013   PLT 299.0 01/11/2013     Patient Active Problem List   Diagnosis Date Noted  . Routine general medical examination at a health care facility 01/17/2013  . HLD (hyperlipidemia) 01/17/2013  . Vitamin D deficiency 10/03/2012  . Anxiety   . Gallstones   . GERD  (gastroesophageal reflux disease)   . Lung nodule    Past Medical History  Diagnosis Date  . Anxiety   . Gallstones   . GERD (gastroesophageal reflux disease)   . History of thyroid nodule 2010  . History of MRSA infection 2007  . Hiatal hernia 2010  . Lung nodule     needs follow up in 05/2013   Past Surgical History  Procedure Laterality Date  . Cesarean section    . Tonsillectomy     History  Substance Use Topics  . Smoking status: Never Smoker   . Smokeless tobacco: Not on file  . Alcohol Use: Not on file   Family History  Problem Relation Age of Onset  . Hypertension Mother   . Kidney disease Brother    No Known Allergies Current Outpatient Prescriptions on File Prior to Visit  Medication Sig Dispense Refill  . dextromethorphan-guaiFENesin (MUCINEX DM) 30-600 MG per 12 hr tablet Take 1 tablet by mouth 2 (two) times daily.    Marland Kitchen dicyclomine (BENTYL) 10 MG capsule TAKE 1 CAPSULE BY MOUTH 4 TIMES A DAY 120 capsule 2  . DULoxetine (CYMBALTA) 60 MG capsule TAKE ONE CAPSULE BY MOUTH EVERY DAY 90 capsule 2  . esomeprazole (NEXIUM) 40 MG capsule Take 40 mg by mouth daily before breakfast.    .  GLUCOSAMINE-CHONDROITIN PO Take by mouth daily.    . valACYclovir (VALTREX) 1000 MG tablet Take 1 tablet (1,000 mg total) by mouth 2 (two) times daily. Until fever blister is gone.  Maximum of 5 days per outbreak. 30 tablet 0   No current facility-administered medications on file prior to visit.   The PMH, PSH, Social History, Family History, Medications, and allergies have been reviewed in [facility], and have been updated if relevant.   ROS:  See HPI Patient reports no  vision/ hearing changes,anorexia, weight change, fever ,adenopathy, persistant / recurrent hoarseness, swallowing issues, chest pain, edema,persistant / recurrent cough, hemoptysis, dyspnea(rest, exertional, paroxysmal nocturnal), gastrointestinal  bleeding (melena, rectal bleeding), abdominal pain, excessive heart burn,  GU symptoms(dysuria, hematuria, pyuria, voiding/incontinence  Issues) syncope, focal weakness, severe memory loss, concerning skin lesions, depression, anxiety, abnormal bruising/bleeding, major joint swelling, breast masses or abnormal vaginal bleeding.    Physical exam: BP 116/66 mmHg  Pulse 78  Temp(Src) 97.8 F (36.6 C) (Oral)  Ht 5' 5.75" (1.67 m)  Wt 215 lb (97.523 kg)  BMI 34.97 kg/m2  SpO2 96%  Wt Readings from Last 3 Encounters:  06/19/14 215 lb (97.523 kg)  04/06/14 212 lb 8 oz (96.389 kg)  09/12/13 206 lb 8 oz (93.668 kg)    General:  Well-developed,well-nourished,in no acute distress; alert,appropriate and cooperative throughout examination Head:  normocephalic and atraumatic.   Eyes:  vision grossly intact, pupils equal, pupils round, and pupils reactive to light.   Ears:  R ear normal and L ear normal.   Nose:  no external deformity.   Mouth:  good dentition.   Neck:  No deformities, masses, or tenderness noted. Breasts:  No mass, nodules, thickening, tenderness, bulging, retraction, inflamation, nipple discharge or skin changes noted.   Lungs:  Normal respiratory effort, chest expands symmetrically. Lungs are clear to auscultation, no crackles or wheezes. Heart:  Normal rate and regular rhythm. S1 and S2 normal without gallop, murmur, click, rub or other extra sounds. Abdomen:  Bowel sounds positive,abdomen soft and non-tender without masses, organomegaly or hernias noted. Rectal:  no external abnormalities.   Genitalia:  Pelvic Exam:        External: normal female genitalia without lesions or masses        Vagina: normal without lesions or masses        Cervix: normal without lesions or masses        Adnexa: normal bimanual exam without masses or fullness        Uterus: normal by palpation        Pap smear: performed Msk:  No deformity or scoliosis noted of thoracic or lumbar spine.   Extremities:  No clubbing, cyanosis, edema, or deformity noted with normal full  range of motion of all joints.   Neurologic:  alert & oriented X3 and gait normal.   Skin:  Intact without suspicious lesions or rashes Cervical Nodes:  No lymphadenopathy noted Axillary Nodes:  No palpable lymphadenopathy Psych:  Cognition and judgment appear intact. Alert and cooperative with normal attention span and concentration. No apparent delusions, illusions, hallucinations

## 2014-06-20 LAB — CYTOLOGY - PAP

## 2014-06-21 ENCOUNTER — Encounter: Payer: Self-pay | Admitting: Family Medicine

## 2014-06-25 ENCOUNTER — Telehealth: Payer: Self-pay

## 2014-06-25 NOTE — Telephone Encounter (Signed)
Monica Carrillo with [facility] sending prior auth for Altabax since 06/19/14; if do not want to do prior auth for Altabax Monica Carrillo said could substitute bactroban ointment and that would be covered by ins.Please advise.

## 2014-06-26 NOTE — Telephone Encounter (Signed)
Ok to substitute bactroban if ok with pt

## 2014-06-26 NOTE — Telephone Encounter (Signed)
Lm on pharmacy vm advising per Dr Deborra Medina, ok to sub altabax for bactroban

## 2014-06-29 ENCOUNTER — Ambulatory Visit: Payer: Self-pay | Admitting: Family Medicine

## 2014-06-29 ENCOUNTER — Telehealth: Payer: Self-pay | Admitting: Pulmonary Disease

## 2014-06-29 NOTE — Telephone Encounter (Signed)
LMTCB

## 2014-07-02 ENCOUNTER — Encounter: Payer: Self-pay | Admitting: Family Medicine

## 2014-07-02 NOTE — Telephone Encounter (Signed)
lmtcb

## 2014-07-03 NOTE — Telephone Encounter (Signed)
Left message with spouse to have pt call the office to inquire about CT and appt with BQ.

## 2014-07-04 NOTE — Telephone Encounter (Signed)
Spoke with spouse who reports pt is not available at this time.  He will ask pt to call the office back.

## 2014-07-06 NOTE — Telephone Encounter (Signed)
Pt states that when she seen BQ in 06/2013 he left it up to her about whether to schedule a f/u CT or not and she went ahead and scheduled it for 02/2014. But after talking to her husband she decided not to do it and canceled it and doesn't think BQ wanted her to f/u.  Please advise if pt needs office f/u.  Pt states we only need to call her back if she needs to schedule f/u appt.  Please advise.

## 2014-07-06 NOTE — Telephone Encounter (Signed)
There is nothing in the notes that say that I left this up to her as optional. My recommendation is that she have a CT chest to follow-up the nodule.

## 2014-07-06 NOTE — Telephone Encounter (Signed)
lmomtcb for pt 

## 2014-07-06 NOTE — Telephone Encounter (Signed)
lmomtcb x1 

## 2014-07-09 ENCOUNTER — Encounter: Payer: Self-pay | Admitting: *Deleted

## 2014-07-09 ENCOUNTER — Other Ambulatory Visit (INDEPENDENT_AMBULATORY_CARE_PROVIDER_SITE_OTHER): Payer: BC Managed Care – PPO

## 2014-07-09 DIAGNOSIS — Z1211 Encounter for screening for malignant neoplasm of colon: Secondary | ICD-10-CM

## 2014-07-09 LAB — FECAL OCCULT BLOOD, IMMUNOCHEMICAL: FECAL OCCULT BLD: NEGATIVE

## 2014-07-09 NOTE — Telephone Encounter (Signed)
lmomtcb x 2  

## 2014-07-10 NOTE — Telephone Encounter (Signed)
Called and spoke to pt. Informed pt of the recommendations per BQ. Pt stated she would discuss the CT with her husband and call back after Christmas about CT and f/u appt with BQ.  Will forward to BQ to make aware. Nothing further needed.

## 2014-08-14 ENCOUNTER — Encounter: Payer: Self-pay | Admitting: Family Medicine

## 2014-08-14 MED ORDER — DULOXETINE HCL 60 MG PO CPEP: 60.0000 mg | ORAL_CAPSULE | Freq: Every day | ORAL | Status: DC

## 2014-11-10 ENCOUNTER — Other Ambulatory Visit: Payer: Self-pay | Admitting: Family Medicine

## 2015-02-28 ENCOUNTER — Encounter: Payer: Self-pay | Admitting: Internal Medicine

## 2015-02-28 ENCOUNTER — Ambulatory Visit (INDEPENDENT_AMBULATORY_CARE_PROVIDER_SITE_OTHER): Payer: BLUE CROSS/BLUE SHIELD | Admitting: Family Medicine

## 2015-02-28 ENCOUNTER — Encounter: Payer: Self-pay | Admitting: Family Medicine

## 2015-02-28 VITALS — BP 118/76 | HR 60 | Temp 97.7°F | Wt 191.8 lb

## 2015-02-28 DIAGNOSIS — K219 Gastro-esophageal reflux disease without esophagitis: Secondary | ICD-10-CM

## 2015-02-28 LAB — H. PYLORI ANTIBODY, IGG: H PYLORI IGG: NEGATIVE

## 2015-02-28 MED ORDER — RANITIDINE HCL 150 MG PO TABS: 150.0000 mg | ORAL_TABLET | Freq: Every day | ORAL | Status: DC

## 2015-02-28 NOTE — Assessment & Plan Note (Signed)
Deteriorated. >25 minutes spent in face to face time with patient, >50% spent in counselling or coordination of care Check H Pylori to rule out as cause of worsening symptoms. Continue Nexium at current dose, stop taking Tums. Add H2 blocker- zantac 150 mg nightly (eRx sent). Refer to GI for endoscopy/further evaluation.

## 2015-02-28 NOTE — Progress Notes (Signed)
Subjective:   Patient ID: Monica Carrillo, female    DOB: 11-29-1955, 59 y.o.   MRN: [id number]  Monica Carrillo is a pleasant 59 y.o. year old female who presents to clinic today with Gastrophageal Reflux  on 02/28/2015  HPI:  GERD- on Nexium 40 mg daily. Last endoscopy in chart 11/18/2004. In 2008- UGI series showed GERD with hiatal hernia. Felt her symptoms were pretty well controlled with Nexium until the past several weeks.  In additional to Nexium she has been taking " a lot of tums" and still having break through symptoms.  + bloating  No nausea or vomiting.  No black or bloody stools.  No abdominal pain.  Does have h/o gallstones.  Has been really working on weight loss/life style changes successfully and was hoping with weight loss, her symptoms would resolve but they have not.  Wt Readings from Last 3 Encounters:  02/28/15 191 lb 12 oz (86.977 kg)  06/19/14 215 lb (97.523 kg)  04/06/14 212 lb 8 oz (96.389 kg)    Current Outpatient Prescriptions on File Prior to Visit  Medication Sig Dispense Refill  . dextromethorphan-guaiFENesin (MUCINEX DM) 30-600 MG per 12 hr tablet Take 1 tablet by mouth 2 (two) times daily.    Marland Kitchen dicyclomine (BENTYL) 10 MG capsule TAKE 1 CAPSULE BY MOUTH 4 TIMES A DAY 120 capsule 2  . DULoxetine (CYMBALTA) 60 MG capsule TAKE 1 CAPSULE (60 MG TOTAL) BY MOUTH DAILY. 90 capsule 1  . esomeprazole (NEXIUM) 40 MG capsule Take 40 mg by mouth daily before breakfast.    . GLUCOSAMINE-CHONDROITIN PO Take by mouth daily.    . retapamulin (ALTABAX) 1 % ointment Apply topically 2 (two) times daily. 15 g 0   No current facility-administered medications on file prior to visit.    No Known Allergies  Past Medical History  Diagnosis Date  . Anxiety   . Gallstones   . GERD (gastroesophageal reflux disease)   . History of thyroid nodule 2010  . History of MRSA infection 2007  . Hiatal hernia 2010  . Lung nodule     needs follow up in 05/2013    Past  Surgical History  Procedure Laterality Date  . Cesarean section    . Tonsillectomy      Family History  Problem Relation Age of Onset  . Hypertension Mother   . Kidney disease Brother     History   Social History  . Marital Status: Married    Spouse Name: N/A  . Number of Children: N/A  . Years of Education: N/A   Occupational History  . Not on file.   Social History Main Topics  . Smoking status: Never Smoker   . Smokeless tobacco: Not on file  . Alcohol Use: Not on file  . Drug Use: Not on file  . Sexual Activity: Not on file   Other Topics Concern  . Not on file   Social History Narrative   Married.  Two children, 5 grandchildren.   Son, daughter in law and two grand daughters live in [place].      Recently moved to [place] from [place].   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   The PMH, PSH, Social History, Family History, Medications, and allergies have been reviewed in [facility], and have been updated if relevant.   Review of Systems  Constitutional: Negative.   HENT: Negative.   Respiratory: Negative.   Cardiovascular: Negative.   Gastrointestinal: Positive for abdominal  distention. Negative for nausea, vomiting, abdominal pain, diarrhea, constipation, blood in stool, anal bleeding and rectal pain.  Endocrine: Negative.   Genitourinary: Negative.   Musculoskeletal: Negative.   Skin: Negative.   Allergic/Immunologic: Negative.   Neurological: Negative.   All other systems reviewed and are negative.      Objective:    BP 118/76 mmHg  Pulse 60  Temp(Src) 97.7 F (36.5 C) (Oral)  Wt 191 lb 12 oz (86.977 kg)  SpO2 96% Wt Readings from Last 3 Encounters:  02/28/15 191 lb 12 oz (86.977 kg)  06/19/14 215 lb (97.523 kg)  04/06/14 212 lb 8 oz (96.389 kg)     Physical Exam  Constitutional: She is oriented to person, place, and time. She appears well-developed and well-nourished. No distress.  HENT:  Head: Normocephalic and  atraumatic.  Eyes: Conjunctivae are normal.  Cardiovascular: Normal rate.   Pulmonary/Chest: Effort normal.  Abdominal: Soft. Bowel sounds are normal. She exhibits no distension and no mass. There is no tenderness. There is no rebound and no guarding.  Musculoskeletal: Normal range of motion. She exhibits no edema.  Neurological: She is alert and oriented to person, place, and time. No cranial nerve deficit.  Skin: Skin is warm and dry.  Psychiatric: She has a normal mood and affect. Her behavior is normal. Judgment and thought content normal.  Nursing note and vitals reviewed.         Assessment & Plan:   Gastroesophageal reflux disease, esophagitis presence not specified No Follow-up on file.

## 2015-02-28 NOTE — Progress Notes (Signed)
Pre visit review using our clinic review tool, if applicable. No additional management support is needed unless otherwise documented below in the visit note. 

## 2015-03-01 ENCOUNTER — Encounter: Payer: Self-pay | Admitting: Family Medicine

## 2015-03-04 ENCOUNTER — Ambulatory Visit (INDEPENDENT_AMBULATORY_CARE_PROVIDER_SITE_OTHER): Payer: BLUE CROSS/BLUE SHIELD | Admitting: Internal Medicine

## 2015-03-04 ENCOUNTER — Encounter: Payer: Self-pay | Admitting: Internal Medicine

## 2015-03-04 VITALS — BP 118/72 | HR 76 | Ht 65.5 in | Wt 192.5 lb

## 2015-03-04 DIAGNOSIS — Z8719 Personal history of other diseases of the digestive system: Secondary | ICD-10-CM

## 2015-03-04 DIAGNOSIS — R1013 Epigastric pain: Secondary | ICD-10-CM

## 2015-03-04 DIAGNOSIS — K219 Gastro-esophageal reflux disease without esophagitis: Secondary | ICD-10-CM

## 2015-03-04 NOTE — Patient Instructions (Signed)
You have been scheduled for an endoscopy. Please follow written instructions given to you at your visit today. If you use inhalers (even only as needed), please bring them with you on the day of your procedure. Your physician has requested that you go to www.[email or web address].com and enter the access code given to you at your visit today. This web site gives a general overview about your procedure. However, you should still follow specific instructions given to you by our office regarding your preparation for the procedure.  Stop Bentyl Continue Nexium Continue ranitidine

## 2015-03-04 NOTE — Progress Notes (Signed)
Patient ID: Monica Carrillo, female   DOB: 1956/08/13, 59 y.o.   MRN: [id number] HPI: Monica Carrillo is a 59 year old female with a past medical history of GERD, lung nodules found stable by surveillance imaging, gallstones, hyperlipidemia who is seen in consultation at the request of Dr. Deborra Medina to evaluate epigastric discomfort and uncontrolled reflux. She is here alone today. She reports that she continues to have issue with epigastric abdominal pain and pressure. This is worse with eating, starts about 20 minutes after eating. True heartburn occurs rarely only 2 times per year. She does feel abdominal bloating. Symptoms are not nocturnal. She's had issues with globus sensation in the past but this is extremely rare now. She denies dysphagia and odynophagia. She has been on Bentyl 10 mg 3 times a day for many years started by prior gastroenterologist. She doesn't think it helps. She has a history of gallstones seen in the time of imaging the follow-up lung nodules. HIDA scan on 03/01/2009 showed reduced gallbladder ejection fraction of 32%. He takes a daily probiotic also not sure this helps. She's taking Nexium 40 mg daily and ranitidine 150 was started last week by primary care. She feels is been too soon to tell a difference. She had a screening colonoscopy performed in November 2019 which she reports was normal. She has lost 26 pounds since April and this is intentional by starting gluten-free diet. Gluten-free diet was started for health reasons not for a diagnosis of celiac disease. Ports bowel movements is regular without blood in her stool or melena. Family history notable for maternal grandfather with esophageal cancer.  Past Medical History  Diagnosis Date  . Anxiety   . Gallstones   . GERD (gastroesophageal reflux disease)   . History of thyroid nodule 2010  . History of MRSA infection 2007  . Hiatal hernia 2010  . Lung nodule     needs follow up in 05/2013  . Hyperlipemia     Past Surgical  History  Procedure Laterality Date  . Cesarean section      x2  . Tonsillectomy      Outpatient Prescriptions Prior to Visit  Medication Sig Dispense Refill  . dicyclomine (BENTYL) 10 MG capsule TAKE 1 CAPSULE BY MOUTH 4 TIMES A DAY 120 capsule 2  . DULoxetine (CYMBALTA) 60 MG capsule TAKE 1 CAPSULE (60 MG TOTAL) BY MOUTH DAILY. 90 capsule 1  . esomeprazole (NEXIUM) 40 MG capsule Take 40 mg by mouth daily before breakfast.    . ranitidine (ZANTAC) 150 MG tablet Take 1 tablet (150 mg total) by mouth at bedtime. 30 tablet 3  . retapamulin (ALTABAX) 1 % ointment Apply topically 2 (two) times daily. (Patient not taking: Reported on 03/04/2015) 15 g 0  . dextromethorphan-guaiFENesin (MUCINEX DM) 30-600 MG per 12 hr tablet Take 1 tablet by mouth 2 (two) times daily.    Marland Kitchen GLUCOSAMINE-CHONDROITIN PO Take by mouth daily.     No facility-administered medications prior to visit.    No Known Allergies  Family History  Problem Relation Age of Onset  . Hypertension Mother   . Kidney disease Brother   . Esophageal cancer Maternal Grandfather   . Heart disease Maternal Grandmother   . Irritable bowel syndrome Father     History  Substance Use Topics  . Smoking status: Former Smoker -- 5 years    Types: Cigarettes    Quit date: 08/17/1977  . Smokeless tobacco: Never Used     Comment: as a teenager  . Alcohol  Use: 0.0 oz/week    0 Standard drinks or equivalent per week     Comment: occ. wine or beer    ROS: As per history of present illness, otherwise negative  BP 118/72 mmHg  Pulse 76  Ht 5' 5.5" (1.664 m)  Wt 192 lb 8 oz (87.317 kg)  BMI 31.53 kg/m2 Constitutional: Well-developed and well-nourished. No distress. HEENT: Normocephalic and atraumatic. Oropharynx is clear and moist. No oropharyngeal exudate. Conjunctivae are normal.  No scleral icterus. Neck: Neck supple. Trachea midline. Cardiovascular: Normal rate, regular rhythm and intact distal pulses. No M/R/G Pulmonary/chest:  Effort normal and breath sounds normal. No wheezing, rales or rhonchi. Abdominal: Soft, nontender, nondistended. Bowel sounds active throughout. There are no masses palpable. No hepatosplenomegaly. Extremities: no clubbing, cyanosis, or edema Lymphadenopathy: No cervical adenopathy noted. Neurological: Alert and oriented to person place and time. Skin: Skin is warm and dry. No rashes noted. Psychiatric: Normal mood and affect. Behavior is normal.  RELEVANT LABS AND IMAGING: CBC    Component Value Date/Time   WBC 5.2 06/19/2014 0927   RBC 4.60 06/19/2014 0927   HGB 14.2 06/19/2014 0927   HCT 42.6 06/19/2014 0927   PLT 265.0 06/19/2014 0927   MCV 92.5 06/19/2014 0927   MCHC 33.3 06/19/2014 0927   RDW 13.7 06/19/2014 0927   LYMPHSABS 1.2 06/19/2014 0927   MONOABS 0.5 06/19/2014 0927   EOSABS 0.2 06/19/2014 0927   BASOSABS 0.0 06/19/2014 0927    CMP     Component Value Date/Time   NA 139 06/19/2014 0927   K 4.0 06/19/2014 0927   CL 105 06/19/2014 0927   CO2 24 06/19/2014 0927   GLUCOSE 79 06/19/2014 0927   BUN 12 06/19/2014 0927   CREATININE 0.9 06/19/2014 0927   CALCIUM 9.0 06/19/2014 0927   PROT 6.9 06/19/2014 0927   ALBUMIN 3.5 06/19/2014 0927   AST 31 06/19/2014 0927   ALT 26 06/19/2014 0927   ALKPHOS 41 06/19/2014 0927   BILITOT 0.6 06/19/2014 2440    ASSESSMENT/PLAN:  59 year old female with a past medical history of GERD, lung nodules found stable by surveillance imaging, gallstones, hyperlipidemia who is seen in consultation at the request of Dr. Deborra Medina to evaluate epigastric discomfort and uncontrolled reflux.   1. Epigastric pain/hx of GERD -- I'm not convinced that her epigastric discomfort is related to GERD. She is not having true heartburn. Could be dyspepsia, rule out ulcer disease. She's been on Nexium 40 mg which we will continue. Ranitidine 150 mg each evening started recently also I would like her to continue this to see if it helps. Upper endoscopy  recommended. We discussed risks, benefits and alternatives and she is agreeable to proceed. I will have her stop Bentyl as it doesn't seem to be helping. Symptoms could be gallbladder related and due to biliary dyskinesia. If upper endoscopy unremarkable would recommend repeat HIDA scan with CCK.  2. Colorectal cancer screening -- repeat colonoscopy recommended November 2019 for screening    Cc:Lucille Passy, Md [address] [place], [place] [address]

## 2015-04-10 ENCOUNTER — Ambulatory Visit (AMBULATORY_SURGERY_CENTER): Payer: BLUE CROSS/BLUE SHIELD | Admitting: Internal Medicine

## 2015-04-10 ENCOUNTER — Encounter: Payer: Self-pay | Admitting: Internal Medicine

## 2015-04-10 VITALS — BP 107/64 | HR 56 | Temp 96.8°F | Resp 27 | Ht 65.0 in | Wt 192.0 lb

## 2015-04-10 DIAGNOSIS — K3189 Other diseases of stomach and duodenum: Secondary | ICD-10-CM | POA: Diagnosis not present

## 2015-04-10 DIAGNOSIS — R1013 Epigastric pain: Secondary | ICD-10-CM | POA: Diagnosis not present

## 2015-04-10 MED ORDER — SODIUM CHLORIDE 0.9 % IV SOLN: 500.0000 mL | INTRAVENOUS | Status: DC

## 2015-04-10 MED ORDER — METRONIDAZOLE 500 MG PO TABS: 500.0000 mg | ORAL_TABLET | Freq: Three times a day (TID) | ORAL | Status: DC

## 2015-04-10 NOTE — Progress Notes (Signed)
Report to PACU, RN, vss, BBS= Clear.  

## 2015-04-10 NOTE — Op Note (Signed)
[place]  [address] [place], [id number]   ENDOSCOPY PROCEDURE REPORT  PATIENT: Kathalina, Ostermann  MR#: [id number] BIRTHDATE: 1955-10-17 , 21  yrs. old GENDER: female ENDOSCOPIST: Jerene Bears, MD REFERRED BY:  Arnette Norris, M.D. PROCEDURE DATE:  04/10/2015 PROCEDURE:  EGD, diagnostic and EGD w/ biopsy ASA CLASS:     Class II INDICATIONS:  epigastric pain. MEDICATIONS: Monitored anesthesia care and Propofol 200 mg IV TOPICAL ANESTHETIC: none  DESCRIPTION OF PROCEDURE: After the risks benefits and alternatives of the procedure were thoroughly explained, informed consent was obtained.  The [id number] endoscope was introduced through the mouth and advanced to the second portion of the duodenum , Without limitations.  The instrument was slowly withdrawn as the mucosa was fully examined.  ESOPHAGUS: The mucosa of the esophagus appeared normal.   Z-line is regular at 39 cm.  STOMACH: Nodular mucosa was found in the prepyloric region of the stomach.   Multiple biopsies were performed using cold forceps. Sample sent for histology.   The stomach otherwise appeared normal.   DUODENUM: The duodenal mucosa showed no abnormalities in the bulb and 2nd part of the duodenum. Retroflexed views revealed no abnormalities.     The scope was then withdrawn from the patient and the procedure completed.  COMPLICATIONS: There were no immediate complications.  ENDOSCOPIC IMPRESSION: 1.   The mucosa of the esophagus appeared normal 2.   Abnormal mucosa was found in the prepyloric region of the stomach; The mucosa was nodular; multiple biopsies were performed 3.   The stomach otherwise appeared normal 4.   The duodenal mucosa showed no abnormalities in the bulb and 2nd part of the duodenum  RECOMMENDATIONS: 1.  Await biopsy results 2.  If epigastric symptoms continue to be troublesome, I would recommend repeating CCK-HIDA given previously borderline gallbladder  ejection fraction raising the question of biliary dyskinesia.  eSigned:  Jerene Bears, MD 04/10/2015 10:29 AMn revised  WT:UUEK, Talia MD and The Patient

## 2015-04-10 NOTE — Progress Notes (Signed)
Called to room to assist during endoscopic procedure.  Patient ID and intended procedure confirmed with present staff. Received instructions for my participation in the procedure from the performing physician.  

## 2015-04-10 NOTE — Patient Instructions (Signed)
Discharge instructions given. Biopsies taken. Resume previous medications. YOU HAD AN ENDOSCOPIC PROCEDURE TODAY AT THE [facility]:   Refer to the procedure report that was given to you for any specific questions about what was found during the examination.  If the procedure report does not answer your questions, please call your gastroenterologist to clarify.  If you requested that your care partner not be given the details of your procedure findings, then the procedure report has been included in a sealed envelope for you to review at your convenience later.  YOU SHOULD EXPECT: Some feelings of bloating in the abdomen. Passage of more gas than usual.  Walking can help get rid of the air that was put into your GI tract during the procedure and reduce the bloating. If you had a lower endoscopy (such as a colonoscopy or flexible sigmoidoscopy) you may notice spotting of blood in your stool or on the toilet paper. If you underwent a bowel prep for your procedure, you may not have a normal bowel movement for a few days.  Please Note:  You might notice some irritation and congestion in your nose or some drainage.  This is from the oxygen used during your procedure.  There is no need for concern and it should clear up in a day or so.  SYMPTOMS TO REPORT IMMEDIATELY:   Following upper endoscopy (EGD)  Vomiting of blood or coffee ground material  New chest pain or pain under the shoulder blades  Painful or persistently difficult swallowing  New shortness of breath  Fever of 100F or higher  Black, tarry-looking stools  For urgent or emergent issues, a gastroenterologist can be reached at any hour by calling [phone].   DIET: Your first meal following the procedure should be a small meal and then it is ok to progress to your normal diet. Heavy or fried foods are harder to digest and may make you feel nauseous or bloated.  Likewise, meals heavy in dairy and vegetables can increase  bloating.  Drink plenty of fluids but you should avoid alcoholic beverages for 24 hours.  ACTIVITY:  You should plan to take it easy for the rest of today and you should NOT DRIVE or use heavy machinery until tomorrow (because of the sedation medicines used during the test).    FOLLOW UP: Our staff will call the number listed on your records the next business day following your procedure to check on you and address any questions or concerns that you may have regarding the information given to you following your procedure. If we do not reach you, we will leave a message.  However, if you are feeling well and you are not experiencing any problems, there is no need to return our call.  We will assume that you have returned to your regular daily activities without incident.  If any biopsies were taken you will be contacted by phone or by letter within the next 1-3 weeks.  Please call us at [phone] if you have not heard about the biopsies in 3 weeks.    SIGNATURES/CONFIDENTIALITY: You and/or your care partner have signed paperwork which will be entered into your electronic medical record.  These signatures attest to the fact that that the information above on your After Visit Summary has been reviewed and is understood.  Full responsibility of the confidentiality of this discharge information lies with you and/or your care-partner. 

## 2015-04-11 ENCOUNTER — Telehealth: Payer: Self-pay | Admitting: *Deleted

## 2015-04-11 NOTE — Telephone Encounter (Signed)
  Follow up Call-  Call back number 04/10/2015  Post procedure Call Back phone  # [phone]  Permission to leave phone message Yes     Patient questions:  Do you have a fever, pain , or abdominal swelling? No. Pain Score  0 *  Have you tolerated food without any problems? Yes.    Have you been able to return to your normal activities? Yes.    Do you have any questions about your discharge instructions: Diet   No. Medications  No. Follow up visit  No.  Do you have questions or concerns about your Care? No.  Actions: * If pain score is 4 or above: No action needed, pain <4.

## 2015-04-16 ENCOUNTER — Telehealth: Payer: Self-pay | Admitting: Internal Medicine

## 2015-04-16 NOTE — Telephone Encounter (Signed)
Explained to patient that her results are not yet available and we will contact her regarding her path report once we receive the results. Pt verbalized understanding and has no question at this time.

## 2015-04-17 ENCOUNTER — Encounter: Payer: Self-pay | Admitting: Internal Medicine

## 2015-04-29 ENCOUNTER — Other Ambulatory Visit: Payer: Self-pay

## 2015-05-16 ENCOUNTER — Other Ambulatory Visit: Payer: Self-pay | Admitting: Family Medicine

## 2015-06-03 ENCOUNTER — Encounter: Payer: Self-pay | Admitting: Internal Medicine

## 2015-06-03 ENCOUNTER — Ambulatory Visit (INDEPENDENT_AMBULATORY_CARE_PROVIDER_SITE_OTHER): Payer: BLUE CROSS/BLUE SHIELD | Admitting: Internal Medicine

## 2015-06-03 VITALS — BP 116/72 | HR 74 | Temp 98.4°F | Wt 188.0 lb

## 2015-06-03 DIAGNOSIS — B029 Zoster without complications: Secondary | ICD-10-CM

## 2015-06-03 MED ORDER — VALACYCLOVIR HCL 1 G PO TABS: 1000.0000 mg | ORAL_TABLET | Freq: Three times a day (TID) | ORAL | Status: DC

## 2015-06-03 NOTE — Patient Instructions (Signed)

## 2015-06-03 NOTE — Progress Notes (Signed)
Subjective:    Patient ID: Monica Carrillo, female    DOB: [birth year], 59 y.o.   MRN: [id number]  HPI  Pt presents to the clinic today to follow up UC. She went to UC with c/o headache and a tingling sensation near her right ear. They diagnosed her with neuralgia and neuritis. They put her on a course of Prednisone and advised her to followup with her PCP. She reports she started the Prednisone yesterday. The tingling has now moved down to her jaw. The pain in her right ear seems worse. She has noticed an intermittent sore throat. They did a rapid strep which was negative. She denies fever, chills or body aches. She has not noticed any rashes. She has been taking Tylenol for the headache. She has not had contacts with anyone who has had similar symptoms.  Review of Systems      Past Medical History  Diagnosis Date  . Anxiety   . Gallstones   . GERD (gastroesophageal reflux disease)   . History of thyroid nodule 2010  . History of MRSA infection 2007  . Hiatal hernia 2010  . Lung nodule     needs follow up in 05/2013  . Hyperlipemia     Current Outpatient Prescriptions  Medication Sig Dispense Refill  . dicyclomine (BENTYL) 10 MG capsule TAKE 1 CAPSULE BY MOUTH 4 TIMES A DAY 120 capsule 2  . DULoxetine (CYMBALTA) 60 MG capsule TAKE 1 CAPSULE (60 MG TOTAL) BY MOUTH DAILY. 90 capsule 1  . esomeprazole (NEXIUM) 40 MG capsule Take 40 mg by mouth daily before breakfast.    . ranitidine (ZANTAC) 150 MG tablet Take 1 tablet (150 mg total) by mouth at bedtime. 30 tablet 3  . retapamulin (ALTABAX) 1 % ointment Apply topically 2 (two) times daily. 15 g 0   No current facility-administered medications for this visit.    No Known Allergies  Family History  Problem Relation Age of Onset  . Hypertension Mother   . Kidney disease Brother   . Esophageal cancer Maternal Grandfather   . Heart disease Maternal Grandmother   . Irritable bowel syndrome Father     Social History   Social  History  . Marital Status: Married    Spouse Name: N/A  . Number of Children: 2  . Years of Education: N/A   Occupational History  . Retired    Social History Main Topics  . Smoking status: Former Smoker -- 5 years    Types: Cigarettes    Quit date: 08/17/1977  . Smokeless tobacco: Never Used     Comment: as a teenager  . Alcohol Use: 0.0 oz/week    0 Standard drinks or equivalent per week     Comment: occ. wine or beer  . Drug Use: No  . Sexual Activity: Not on file   Other Topics Concern  . Not on file   Social History Narrative   Married.  Two children, 5 grandchildren.   Son, daughter in law and two grand daughters live in [place].      Recently moved to [place] from [place].   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     Constitutional: Pt reports headache. Denies fever, malaise, fatigue, or abrupt weight changes.  HEENT: Pt reports sore throat. Denies eye pain, eye redness, ear pain, ringing in the ears, wax buildup, runny nose, nasal congestion, bloody nose. Respiratory: Denies difficulty breathing, shortness of breath, cough or sputum production.  Cardiovascular: Denies chest pain, chest tightness, palpitations or swelling in the hands or feet.  Skin: Denies redness, rashes, lesions or ulcercations.  Neurological: Pt reports tingling sensation behind right ear. Denies dizziness, difficulty with memory, difficulty with speech or problems with balance and coordination.    No other specific complaints in a complete review of systems (except as listed in HPI above).  Objective:   Physical Exam   BP 116/72 mmHg  Pulse 74  Temp(Src) 98.4 F (36.9 C) (Oral)  Wt 188 lb (85.276 kg)  SpO2 98% Wt Readings from Last 3 Encounters:  06/03/15 188 lb (85.276 kg)  04/10/15 192 lb (87.091 kg)  03/04/15 192 lb 8 oz (87.317 kg)    General: Appears her stated age, well developed, well nourished in NAD. Skin: Warm, dry and intact. She has a  grouped, vesicular lesions noted on scalp behind right ear. HEENT: Head: normal shape and size, no sinus tenderness noted; Eyes: sclera white, no icterus, conjunctiva pink; Ears: bilateral cerumen impaction; Throat/Mouth: Teeth present, mucosa pink and moist, no exudate, lesions or ulcerations noted.  Neck:  No adenopathy noted.  Cardiovascular: Normal rate and rhythm. S1,S2 noted.  No murmur, rubs or gallops noted.  Pulmonary/Chest: Normal effort and positive vesicular breath sounds. No respiratory distress. No wheezes, rales or ronchi noted.  Neurological: Alert and oriented.    BMET    Component Value Date/Time   NA 139 06/19/2014 0927   K 4.0 06/19/2014 0927   CL 105 06/19/2014 0927   CO2 24 06/19/2014 0927   GLUCOSE 79 06/19/2014 0927   BUN 12 06/19/2014 0927   CREATININE 0.9 06/19/2014 0927   CALCIUM 9.0 06/19/2014 0927    Lipid Panel     Component Value Date/Time   CHOL 229* 06/19/2014 0927   TRIG 88.0 06/19/2014 0927   HDL 58.60 06/19/2014 0927   CHOLHDL 4 06/19/2014 0927   VLDL 17.6 06/19/2014 0927   LDLCALC 153* 06/19/2014 0927    CBC    Component Value Date/Time   WBC 5.2 06/19/2014 0927   RBC 4.60 06/19/2014 0927   HGB 14.2 06/19/2014 0927   HCT 42.6 06/19/2014 0927   PLT 265.0 06/19/2014 0927   MCV 92.5 06/19/2014 0927   MCHC 33.3 06/19/2014 0927   RDW 13.7 06/19/2014 0927   LYMPHSABS 1.2 06/19/2014 0927   MONOABS 0.5 06/19/2014 0927   EOSABS 0.2 06/19/2014 0927   BASOSABS 0.0 06/19/2014 0927    Hgb A1C No results found for: HGBA1C      Assessment & Plan:   Shingles:  She can continue the prednisone (some thought that it would prevent PHN eRx for Valtrex 1 gm TID x 7 days Educated her about staying away from immunocompromised people OK to continue Tylenol for headache Get your shingles vaccine once this is cleared up Return precautions given  RTC as needed or if symptoms persist or worsen

## 2015-06-03 NOTE — Progress Notes (Signed)
Pre visit review using our clinic review tool, if applicable. No additional management support is needed unless otherwise documented below in the visit note. 

## 2015-06-04 ENCOUNTER — Telehealth: Payer: Self-pay | Admitting: Family Medicine

## 2015-06-04 NOTE — Telephone Encounter (Signed)
[facility]     Patient Name: Monica Carrillo Client [place] Night - Client    Client Site [place]    Physician Deborra Medina, [place]     Contact Type Call    Call Type Triage / Clinical  Gender: Female Caller Name Verenis Nicosia  DOB: 1956-05-20  Relationship To Patient Spouse  Age: 59 Y 30 M 19 D Return Phone Number [phone] (Primary)  Return Phone Number: [phone] (Primary) Chief Complaint Headache  Address:  Initial Comment Caller states wife was seen yesterday, has shingles on her head, having bad headaches  City/State/Zip: [place]  PreDisposition Go to Urgent Care/Walk-In Clinic    Nurse Assessment  Nurse: Amalia Hailey, RN, Melissa Date/Time (Eastern Time): 06/04/2015 5:47:38 PM  Confirm and document reason for call. If symptomatic, describe symptoms. ---Caller states wife was seen yesterday, has shingles on her head, having bad headaches   Has the patient traveled out of the country within the last 30 days? ---Not Applicable   Does the patient have any new or worsening symptoms? ---Yes   Will a triage be completed? ---Yes   Related visit to physician within the last 2 weeks? ---Yes   Does the PT have any chronic conditions? (i.e. diabetes, asthma, etc.) ---Yes   List chronic conditions. ---dx shingles, reflux     Guidelines      Guideline Title Affirmed Question Affirmed Notes Nurse Date/Time Eilene Ghazi Time)  Shingles SEVERE pain (e.g., excruciating)  Amalia Hailey, RN, Melissa 06/04/2015 5:52:03 PM  Disp. Time Eilene Ghazi Time) Disposition Final User         06/04/2015 5:58:32 PM See Physician within 4 Hours (or PCP triage) Yes Amalia Hailey, RN, Melissa   Disposition Overriden: See Physician within 24 Hours   Override Reason: Patients symptoms need a higher level of care   Caller Understands: Yes   Disagree/Comply: Comply      Care Advice Given Per  Guideline         SEE PHYSICIAN WITHIN 24 HOURS: CALL BACK IF: * You become worse.   After Care Instructions Given     Call Event Type User Date / Time Description                          Comments  User: Colin Ina, RN Date/Time Eilene Ghazi Time): 06/04/2015 6:09:05 PM  Called the caller back and informed there are no appts available this late in the office. Recommended they go to UC in the area to have her evaluated for her pain to obtain a stronger medication. Advised it is not recommend to exceed over 3000mg  Tylenol in 24 hrs period. Caller verb. understood this information given.

## 2015-06-05 ENCOUNTER — Telehealth: Payer: Self-pay | Admitting: Family Medicine

## 2015-06-05 ENCOUNTER — Encounter: Payer: Self-pay | Admitting: Family Medicine

## 2015-06-05 ENCOUNTER — Ambulatory Visit: Payer: Self-pay | Admitting: Family Medicine

## 2015-06-05 ENCOUNTER — Ambulatory Visit (INDEPENDENT_AMBULATORY_CARE_PROVIDER_SITE_OTHER): Payer: BLUE CROSS/BLUE SHIELD | Admitting: Family Medicine

## 2015-06-05 ENCOUNTER — Telehealth: Payer: Self-pay

## 2015-06-05 VITALS — BP 116/70 | HR 74 | Temp 97.6°F | Wt 185.2 lb

## 2015-06-05 DIAGNOSIS — B029 Zoster without complications: Secondary | ICD-10-CM

## 2015-06-05 MED ORDER — OXYCODONE HCL 5 MG PO TABS: 5.0000 mg | ORAL_TABLET | ORAL | Status: DC | PRN

## 2015-06-05 NOTE — Patient Instructions (Signed)

## 2015-06-05 NOTE — Assessment & Plan Note (Signed)
Advised pt to finish course of valtrex and prednisone as prescribed. Dicussed course of shingles and advised that she get shingles vaccine once rash resolved. Rx given for oxycodone to use for severe pain, continue scheduled tylenol. Call or return to clinic prn if these symptoms worsen or fail to improve as anticipated. The patient indicates understanding of these issues and agrees with the plan.

## 2015-06-05 NOTE — Progress Notes (Signed)
Subjective:   Patient ID: Monica Carrillo, female    DOB: 18-Jan-1956, 59 y.o.   MRN: [id number]  Darlean Warmoth is a pleasant 59 y.o. year old female who presents to clinic today with Herpes Zoster and Headache  on 06/05/2015  HPI:  Shingles-  Went to UC on 10/16 (3 days ago)- had a "prickly" feeling down her right jaw. Blood work done, rapid strep neg, and given prednisone.  Saw Webb Silversmith the next day on 10/17- note reviewed.  She had grouped vesicular lesions in same distrubution.  eRx sent for Valtrex 1 gram three times daily x 7 days and advised Tylenol as needed for pain.  Rash seems to be spreading.  Now has it on her neck.  Very painful- 10/10 and has a headache.  No blurred vision. She is taking scheduled Tylenol.  Current Outpatient Prescriptions on File Prior to Visit  Medication Sig Dispense Refill  . DULoxetine (CYMBALTA) 60 MG capsule TAKE 1 CAPSULE (60 MG TOTAL) BY MOUTH DAILY. 90 capsule 1  . esomeprazole (NEXIUM) 40 MG capsule Take 40 mg by mouth daily before breakfast.    . predniSONE (DELTASONE) 10 MG tablet Take 40 mg by mouth daily.   0  . ranitidine (ZANTAC) 150 MG tablet Take 1 tablet (150 mg total) by mouth at bedtime. 30 tablet 3  . retapamulin (ALTABAX) 1 % ointment Apply topically 2 (two) times daily. 15 g 0  . valACYclovir (VALTREX) 1000 MG tablet Take 1 tablet (1,000 mg total) by mouth 3 (three) times daily. 21 tablet 0   No current facility-administered medications on file prior to visit.    No Known Allergies  Past Medical History  Diagnosis Date  . Anxiety   . Gallstones   . GERD (gastroesophageal reflux disease)   . History of thyroid nodule 2010  . History of MRSA infection 2007  . Hiatal hernia 2010  . Lung nodule     needs follow up in 05/2013  . Hyperlipemia     Past Surgical History  Procedure Laterality Date  . Cesarean section      x2  . Tonsillectomy      Family History  Problem Relation Age of Onset  . Hypertension  Mother   . Kidney disease Brother   . Esophageal cancer Maternal Grandfather   . Heart disease Maternal Grandmother   . Irritable bowel syndrome Father     Social History   Social History  . Marital Status: Married    Spouse Name: N/A  . Number of Children: 2  . Years of Education: N/A   Occupational History  . Retired    Social History Main Topics  . Smoking status: Former Smoker -- 5 years    Types: Cigarettes    Quit date: 08/17/1977  . Smokeless tobacco: Never Used     Comment: as a teenager  . Alcohol Use: 0.0 oz/week    0 Standard drinks or equivalent per week     Comment: occ. wine or beer  . Drug Use: No  . Sexual Activity: Not on file   Other Topics Concern  . Not on file   Social History Narrative   Married.  Two children, 5 grandchildren.   Son, daughter in law and two grand daughters live in [place].      Recently moved to [place] from [place].   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   The PMH, PSH, Social History, Family History, Medications, and  allergies have been reviewed in [facility], and have been updated if relevant.    Review of Systems  Constitutional: Negative for fever.  Eyes: Negative for visual disturbance.  Skin: Positive for rash.  Allergic/Immunologic: Negative for immunocompromised state.  Neurological: Positive for headaches.  All other systems reviewed and are negative.      Objective:    BP 116/70 mmHg  Pulse 74  Temp(Src) 97.6 F (36.4 C) (Oral)  Wt 185 lb 4 oz (84.029 kg)  SpO2 98%   Physical Exam  Constitutional: She is oriented to person, place, and time. She appears well-developed and well-nourished. She appears distressed.  HENT:  Head: Normocephalic.  Eyes: Conjunctivae are normal.  Cardiovascular: Normal rate.   Pulmonary/Chest: Effort normal.  Musculoskeletal: Normal range of motion.  Neurological: She is alert and oriented to person, place, and time. No cranial nerve deficit.  Skin:       Psychiatric: She has a normal mood and affect. Her behavior is normal. Judgment and thought content normal.  Nursing note and vitals reviewed.         Assessment & Plan:   Shingles No Follow-up on file.

## 2015-06-05 NOTE — Telephone Encounter (Signed)
Yes ok to take both together.

## 2015-06-05 NOTE — Telephone Encounter (Signed)
Patient left a message and states that was in the office earlier today and Dr. Deborra Medina prescribed Hydrocodone. Patient is wanting to know if it is okay if she takes both the Oxycodone and Acetaminophen? Please advise

## 2015-06-05 NOTE — Telephone Encounter (Signed)
Spoke to pt and advised per Dr Aron.  

## 2015-06-05 NOTE — Progress Notes (Signed)
Pre visit review using our clinic review tool, if applicable. No additional management support is needed unless otherwise documented below in the visit note. 

## 2015-06-05 NOTE — Telephone Encounter (Signed)
[place] Patient Name: Monica Carrillo DOB: 20-Jan-1956 Initial Comment Caller states wife saw Dr Gelene Mink Monday for shingles; on her scalp; excrutiating headache since yesterday; wants stronger med than OTC tylenol for pain; two more days of prednisone; was give Valtrex Monday; called last [phone] Nurse Assessment Nurse: Markus Daft, RN, Sherre Poot Date/Time (Eastern Time): 06/05/2015 9:22:08 AM Confirm and document reason for call. If symptomatic, describe symptoms. ---Caller states she saw Dr Gelene Mink Monday for shingles on her scalp. She has excruciating headache since yesterday, and wants stronger med than OTC Tylenol for pain. She has two more days of prednisone, and was give Valtrex Monday. She had severe pain at 10/10 last night with nausea. This AM, pain is 9/10, and able to eat yogurt and crackers. Has the patient traveled out of the country within the last 30 days? ---Not Applicable Does the patient have any new or worsening symptoms? ---Yes Will a triage be completed? ---Yes Related visit to physician within the last 2 weeks? ---Yes Does the PT have any chronic conditions? (i.e. diabetes, asthma, etc.) ---Yes List chronic conditions. ---Acid Reflux Guidelines Guideline Title Affirmed Question Affirmed Notes Shingles [1] Shingles rash of face AND [2] eye pain or blurred vision ---> shingles rash is on the right ear in and in ear and now on the neck and on the cheek. With new eye pain. Final Disposition User See Physician within 4 Hours (or PCP triage) Markus Daft, RN, Sherre Poot Comments Appt made with Dr. Deborra Medina today at 12 pm. Requesting something stronger for pain as well. Referrals REFERRED TO PCP OFFICE Disagree/Comply: Comply

## 2015-06-10 ENCOUNTER — Telehealth: Payer: Self-pay | Admitting: *Deleted

## 2015-06-10 NOTE — Telephone Encounter (Signed)
It sounds like she is heading in the right direction.  These symptoms can persist and hopefully not much longer. Like we talked about, sometimes can last for months but hopefully since she is feeling better, this wont be the case.  Keep [place] updated please.

## 2015-06-10 NOTE — Telephone Encounter (Signed)
Spoke to pt who states the rash is almost completely gone, which has scabbed over.The pain med she was prescribed is beneficial, but still has a "tingly" feeling where the rash was present. She is wanting to know what to expect now that the rash has cleared. HA is present, but "not as bad as it was."

## 2015-06-10 NOTE — Telephone Encounter (Signed)
Patient was diagnosed with shingles on 10/19 and treated with 7 days of Valtrex.  She reports that she is still not feeling well, no new spots in the past four days.  She would like advise on what to do next.  Is more medication required?  How long should before she should see improvement?  Please advise.

## 2015-06-10 NOTE — Telephone Encounter (Signed)
Spoke to pt and advised per Dr Aron; pt verbally expressed understanding.  

## 2015-06-10 NOTE — Telephone Encounter (Signed)
How does the rash look?  Is it scabbing over?  No, more medication (valtrex) would not be beneficial.  Is the pain medication helping?

## 2015-06-21 ENCOUNTER — Telehealth: Payer: Self-pay

## 2015-06-21 ENCOUNTER — Other Ambulatory Visit: Payer: Self-pay | Admitting: Family Medicine

## 2015-06-21 DIAGNOSIS — E559 Vitamin D deficiency, unspecified: Secondary | ICD-10-CM

## 2015-06-21 DIAGNOSIS — E785 Hyperlipidemia, unspecified: Secondary | ICD-10-CM

## 2015-06-21 DIAGNOSIS — Z0000 Encounter for general adult medical examination without abnormal findings: Secondary | ICD-10-CM

## 2015-06-21 DIAGNOSIS — B029 Zoster without complications: Secondary | ICD-10-CM

## 2015-06-21 NOTE — Telephone Encounter (Signed)
Spoke to pt and advised per Dr Deborra Medina. Pt advised to await a call with appt details

## 2015-06-21 NOTE — Telephone Encounter (Signed)
Sorry to hear she is having such a tough time.  Yes I do agree that an ENT referral is prudent.  I will place this now.

## 2015-06-21 NOTE — Telephone Encounter (Signed)
Pt left v/m; pt was seen 06/05/15 with shingles in scalp and ear; pt is concerned about some facial nerve pain and pt has decreased hearing in ear and has a humming sound in ear. Pt understands shingles varies how long symptoms last but pt understands shingles can last 3-5 weeks. Pt wants to know if needs to f/u with Dr Deborra Medina or should pt see ENT about hearing returning to normal. Pt request cb. Dr Deborra Medina out of office but may be reviewing desk top; will send note to Dr Deborra Medina and Dr Diona Browner who is in office today.

## 2015-06-21 NOTE — Telephone Encounter (Signed)
Noted  

## 2015-06-24 ENCOUNTER — Other Ambulatory Visit: Payer: BLUE CROSS/BLUE SHIELD

## 2015-06-24 ENCOUNTER — Other Ambulatory Visit: Payer: Self-pay | Admitting: Family Medicine

## 2015-06-25 ENCOUNTER — Encounter: Payer: BLUE CROSS/BLUE SHIELD | Admitting: Family Medicine

## 2015-06-26 ENCOUNTER — Other Ambulatory Visit: Payer: Self-pay | Admitting: Family Medicine

## 2015-06-26 DIAGNOSIS — E785 Hyperlipidemia, unspecified: Secondary | ICD-10-CM

## 2015-06-26 DIAGNOSIS — Z0000 Encounter for general adult medical examination without abnormal findings: Secondary | ICD-10-CM

## 2015-06-26 DIAGNOSIS — E559 Vitamin D deficiency, unspecified: Secondary | ICD-10-CM

## 2015-07-01 ENCOUNTER — Other Ambulatory Visit (INDEPENDENT_AMBULATORY_CARE_PROVIDER_SITE_OTHER): Payer: BLUE CROSS/BLUE SHIELD

## 2015-07-01 DIAGNOSIS — E785 Hyperlipidemia, unspecified: Secondary | ICD-10-CM

## 2015-07-01 DIAGNOSIS — Z0000 Encounter for general adult medical examination without abnormal findings: Secondary | ICD-10-CM | POA: Diagnosis not present

## 2015-07-01 DIAGNOSIS — E559 Vitamin D deficiency, unspecified: Secondary | ICD-10-CM | POA: Diagnosis not present

## 2015-07-01 LAB — CBC WITH DIFFERENTIAL/PLATELET
BASOS ABS: 0 10*3/uL (ref 0.0–0.1)
Basophils Relative: 0.2 % (ref 0.0–3.0)
EOS ABS: 0 10*3/uL (ref 0.0–0.7)
Eosinophils Relative: 0.4 % (ref 0.0–5.0)
HEMATOCRIT: 42.9 % (ref 36.0–46.0)
HEMOGLOBIN: 14.3 g/dL (ref 12.0–15.0)
LYMPHS PCT: 24.5 % (ref 12.0–46.0)
Lymphs Abs: 2.3 10*3/uL (ref 0.7–4.0)
MCHC: 33.3 g/dL (ref 30.0–36.0)
MCV: 94.9 fl (ref 78.0–100.0)
MONOS PCT: 8.2 % (ref 3.0–12.0)
Monocytes Absolute: 0.8 10*3/uL (ref 0.1–1.0)
NEUTROS ABS: 6.3 10*3/uL (ref 1.4–7.7)
Neutrophils Relative %: 66.7 % (ref 43.0–77.0)
PLATELETS: 341 10*3/uL (ref 150.0–400.0)
RBC: 4.52 Mil/uL (ref 3.87–5.11)
RDW: 14.1 % (ref 11.5–15.5)
WBC: 9.4 10*3/uL (ref 4.0–10.5)

## 2015-07-01 LAB — COMPREHENSIVE METABOLIC PANEL
ALBUMIN: 3.8 g/dL (ref 3.5–5.2)
ALK PHOS: 45 U/L (ref 39–117)
ALT: 27 U/L (ref 0–35)
AST: 15 U/L (ref 0–37)
BILIRUBIN TOTAL: 0.3 mg/dL (ref 0.2–1.2)
BUN: 17 mg/dL (ref 6–23)
CALCIUM: 9.6 mg/dL (ref 8.4–10.5)
CO2: 32 mEq/L (ref 19–32)
CREATININE: 0.74 mg/dL (ref 0.40–1.20)
Chloride: 103 mEq/L (ref 96–112)
GFR: 85.91 mL/min (ref >60.00)
Glucose, Bld: 72 mg/dL (ref 70–99)
Potassium: 4.5 mEq/L (ref 3.5–5.1)
Sodium: 142 mEq/L (ref 135–145)
TOTAL PROTEIN: 6.5 g/dL (ref 6.0–8.3)

## 2015-07-01 LAB — LIPID PANEL
CHOL/HDL RATIO: 4
CHOLESTEROL: 210 mg/dL — AB (ref 0–200)
HDL: 58.9 mg/dL (ref >39.00)
LDL Cholesterol: 130 mg/dL — ABNORMAL HIGH (ref 0–99)
NonHDL: 151.3
TRIGLYCERIDES: 109 mg/dL (ref 0.0–149.0)
VLDL: 21.8 mg/dL (ref 0.0–40.0)

## 2015-07-01 LAB — TSH: TSH: 0.81 u[IU]/mL (ref 0.35–4.50)

## 2015-07-01 LAB — VITAMIN D 25 HYDROXY (VIT D DEFICIENCY, FRACTURES): VITD: 41.86 ng/mL (ref 30.00–100.00)

## 2015-07-02 ENCOUNTER — Ambulatory Visit (INDEPENDENT_AMBULATORY_CARE_PROVIDER_SITE_OTHER): Payer: BLUE CROSS/BLUE SHIELD | Admitting: Family Medicine

## 2015-07-02 ENCOUNTER — Encounter: Payer: Self-pay | Admitting: Family Medicine

## 2015-07-02 VITALS — BP 134/78 | HR 77 | Temp 97.4°F | Ht 65.75 in | Wt 192.2 lb

## 2015-07-02 DIAGNOSIS — Z1211 Encounter for screening for malignant neoplasm of colon: Secondary | ICD-10-CM

## 2015-07-02 DIAGNOSIS — Z23 Encounter for immunization: Secondary | ICD-10-CM

## 2015-07-02 DIAGNOSIS — B029 Zoster without complications: Secondary | ICD-10-CM

## 2015-07-02 DIAGNOSIS — F419 Anxiety disorder, unspecified: Secondary | ICD-10-CM

## 2015-07-02 DIAGNOSIS — E785 Hyperlipidemia, unspecified: Secondary | ICD-10-CM

## 2015-07-02 DIAGNOSIS — Z0000 Encounter for general adult medical examination without abnormal findings: Secondary | ICD-10-CM

## 2015-07-02 DIAGNOSIS — E559 Vitamin D deficiency, unspecified: Secondary | ICD-10-CM

## 2015-07-02 LAB — HIV ANTIBODY (ROUTINE TESTING W REFLEX): HIV 1&2 Ab, 4th Generation: NONREACTIVE

## 2015-07-02 LAB — HEPATITIS C ANTIBODY: HCV AB: NEGATIVE

## 2015-07-02 NOTE — Assessment & Plan Note (Signed)
Reviewed preventive care protocols, scheduled due services, and updated immunizations Discussed nutrition, exercise, diet, and healthy lifestyle.  Influenza vaccine today.  

## 2015-07-02 NOTE — Progress Notes (Signed)
Pre visit review using our clinic review tool, if applicable. No additional management support is needed unless otherwise documented below in the visit note. 

## 2015-07-02 NOTE — Assessment & Plan Note (Signed)
Improving slowly. She is understandably frustrated. Keep appt with ENT.

## 2015-07-02 NOTE — Addendum Note (Signed)
Addended by: Ellamae Sia on: 07/02/2015 02:50 PM   Modules accepted: Orders

## 2015-07-02 NOTE — Assessment & Plan Note (Signed)
Good control.  No changes made today.  

## 2015-07-02 NOTE — Progress Notes (Signed)
Subjective:    Patient ID: Monica Carrillo, female    DOB: 1955-09-10, 59 y.o.   MRN: [id number]  HPI  Very pleasant 59 yo female here for CPX and follow up of chronic medical conditions. Colonoscopy UTD- 2009 Neg IFOB 07/09/14 Denies changes in her bowel habits or blood in her stool. Mammogram 06/29/14.    No h/o abnormal pap smears.  Last pap smear done by me on 06/19/14.   LMP was in 06/2014.  Denies any hot flashes, insomnia, or vaginal dryness.  Shingles- Diagnosed with shingles of scalp and ear on  06/05/15.  Referred to ENT for persistent erve pain and pt has decreased hearing and humming in ear. Saw Dr. Tami Ribas who placed her on prednisone- has 5 days left.  She is starting to feel better. Has follow up with ENT on 11/28.  GERD- on Nexium 40 mg daily.  In 2008- UGI series showed GERD with hiatal hernia. Feels her symptoms are pretty well controlled with Nexium.  Anxiety- has been quite well controlled on Cymbalta 60 mg daily.  Denies any anxiety or depression.  Loves living in this area.     HLD- LDL elevated when we initially checked in 12/2012 (LDL 172)- she wanted to work on diet and made dramatic improvement- LDL decreased to 134 in 04/2013 and even better now at 130. No family h/o HLD.   Lab Results  Component Value Date   CHOL 210* 07/01/2015   HDL 58.90 07/01/2015   LDLCALC 130* 07/01/2015   LDLDIRECT 134.8 05/05/2013   TRIG 109.0 07/01/2015   CHOLHDL 4 07/01/2015   Lab Results  Component Value Date   NA 142 07/01/2015   K 4.5 07/01/2015   CL 103 07/01/2015   CO2 32 07/01/2015   Lab Results  Component Value Date   CREATININE 0.74 07/01/2015   Lab Results  Component Value Date   WBC 9.4 07/01/2015   HGB 14.3 07/01/2015   HCT 42.9 07/01/2015   MCV 94.9 07/01/2015   PLT 341.0 07/01/2015     Patient Active Problem List   Diagnosis Date Noted  . Shingles 06/05/2015  . Hidradenitis suppurativa 06/19/2014  . Routine general medical examination at a  health care facility 01/17/2013  . HLD (hyperlipidemia) 01/17/2013  . Vitamin D deficiency 10/03/2012  . Anxiety   . Gallstones   . GERD (gastroesophageal reflux disease)   . Lung nodule    Past Medical History  Diagnosis Date  . Anxiety   . Gallstones   . GERD (gastroesophageal reflux disease)   . History of thyroid nodule 2010  . History of MRSA infection 2007  . Hiatal hernia 2010  . Lung nodule     needs follow up in 05/2013  . Hyperlipemia    Past Surgical History  Procedure Laterality Date  . Cesarean section      x2  . Tonsillectomy     Social History  Substance Use Topics  . Smoking status: Former Smoker -- 5 years    Types: Cigarettes    Quit date: 08/17/1977  . Smokeless tobacco: Never Used     Comment: as a teenager  . Alcohol Use: 0.0 oz/week    0 Standard drinks or equivalent per week     Comment: occ. wine or beer   Family History  Problem Relation Age of Onset  . Hypertension Mother   . Kidney disease Brother   . Esophageal cancer Maternal Grandfather   . Heart disease Maternal Grandmother   .  Irritable bowel syndrome Father    No Known Allergies Current Outpatient Prescriptions on File Prior to Visit  Medication Sig Dispense Refill  . DULoxetine (CYMBALTA) 60 MG capsule TAKE 1 CAPSULE (60 MG TOTAL) BY MOUTH DAILY. 90 capsule 1  . esomeprazole (NEXIUM) 40 MG capsule Take 40 mg by mouth daily before breakfast.    . oxyCODONE (OXY IR/ROXICODONE) 5 MG immediate release tablet Take 1 tablet (5 mg total) by mouth every 4 (four) hours as needed for severe pain. 30 tablet 0  . predniSONE (DELTASONE) 10 MG tablet Take 40 mg by mouth daily.   0  . ranitidine (ZANTAC) 150 MG tablet TAKE 1 TABLET (150 MG TOTAL) BY MOUTH AT BEDTIME. 30 tablet 8  . retapamulin (ALTABAX) 1 % ointment Apply topically 2 (two) times daily. 15 g 0  . valACYclovir (VALTREX) 1000 MG tablet Take 1 tablet (1,000 mg total) by mouth 3 (three) times daily. 21 tablet 0   No current  facility-administered medications on file prior to visit.   The PMH, PSH, Social History, Family History, Medications, and allergies have been reviewed in [facility], and have been updated if relevant.   Review of Systems  Constitutional: Negative.   HENT: Positive for ear pain and hearing loss. Negative for facial swelling.   Respiratory: Negative.   Cardiovascular: Negative.   Gastrointestinal: Negative.   Endocrine: Negative.   Genitourinary: Negative.   Musculoskeletal: Negative.   Skin: Negative.   Allergic/Immunologic: Negative.   Neurological: Negative.   Hematological: Negative.   Psychiatric/Behavioral: Negative.   All other systems reviewed and are negative.   Physical exam: BP 134/78 mmHg  Pulse 77  Temp(Src) 97.4 F (36.3 C) (Oral)  Ht 5' 5.75" (1.67 m)  Wt 192 lb 4 oz (87.204 kg)  BMI 31.27 kg/m2  SpO2 99%  Wt Readings from Last 3 Encounters:  07/02/15 192 lb 4 oz (87.204 kg)  06/05/15 185 lb 4 oz (84.029 kg)  06/03/15 188 lb (85.276 kg)    General:  Well-developed,well-nourished,in no acute distress; alert,appropriate and cooperative throughout examination Head:  normocephalic and atraumatic.   Eyes:  vision grossly intact, pupils equal, pupils round, and pupils reactive to light.   Ears:  R ear normal and L ear normal.   Nose:  no external deformity.   Mouth:  good dentition.   Neck:  No deformities, masses, or tenderness noted. Breasts:  No mass, nodules, thickening, tenderness, bulging, retraction, inflamation, nipple discharge or skin changes noted.   Lungs:  Normal respiratory effort, chest expands symmetrically. Lungs are clear to auscultation, no crackles or wheezes. Heart:  Normal rate and regular rhythm. S1 and S2 normal without gallop, murmur, click, rub or other extra sounds. Abdomen:  Bowel sounds positive,abdomen soft and non-tender without masses, organomegaly or hernias noted. Msk:  No deformity or scoliosis noted of thoracic or lumbar spine.    Extremities:  No clubbing, cyanosis, edema, or deformity noted with normal full range of motion of all joints.   Neurologic:  alert & oriented X3 and gait normal.   Skin:  Intact without suspicious lesions or rashes Cervical Nodes:  No lymphadenopathy noted Axillary Nodes:  No palpable lymphadenopathy Psych:  Cognition and judgment appear intact. Alert and cooperative with normal attention span and concentration. No apparent delusions, illusions, hallucinations

## 2015-07-02 NOTE — Patient Instructions (Signed)
Great to see you. Please call [facility] to set up your mammogram.

## 2015-07-15 ENCOUNTER — Other Ambulatory Visit: Payer: Self-pay | Admitting: Unknown Physician Specialty

## 2015-07-15 DIAGNOSIS — H905 Unspecified sensorineural hearing loss: Secondary | ICD-10-CM

## 2015-07-15 DIAGNOSIS — H903 Sensorineural hearing loss, bilateral: Secondary | ICD-10-CM

## 2015-07-16 ENCOUNTER — Other Ambulatory Visit: Payer: Self-pay | Admitting: Family Medicine

## 2015-07-16 DIAGNOSIS — Z1231 Encounter for screening mammogram for malignant neoplasm of breast: Secondary | ICD-10-CM

## 2015-08-05 ENCOUNTER — Ambulatory Visit
Admission: RE | Admit: 2015-08-05 | Discharge: 2015-08-05 | Disposition: A | Discharge status: Home / Self Care | Payer: BLUE CROSS/BLUE SHIELD | Source: Ambulatory Visit | Attending: Family Medicine | Admitting: Family Medicine

## 2015-08-05 ENCOUNTER — Ambulatory Visit
Admission: RE | Admit: 2015-08-05 | Discharge: 2015-08-05 | Disposition: A | Discharge status: Home / Self Care | Payer: BLUE CROSS/BLUE SHIELD | Source: Ambulatory Visit | Attending: Unknown Physician Specialty | Admitting: Unknown Physician Specialty

## 2015-08-05 DIAGNOSIS — R921 Mammographic calcification found on diagnostic imaging of breast: Secondary | ICD-10-CM | POA: Diagnosis not present

## 2015-08-05 DIAGNOSIS — Z1231 Encounter for screening mammogram for malignant neoplasm of breast: Secondary | ICD-10-CM | POA: Diagnosis present

## 2015-08-05 DIAGNOSIS — H905 Unspecified sensorineural hearing loss: Secondary | ICD-10-CM | POA: Insufficient documentation

## 2015-08-05 DIAGNOSIS — H903 Sensorineural hearing loss, bilateral: Secondary | ICD-10-CM

## 2015-08-05 MED ORDER — GADOBENATE DIMEGLUMINE 529 MG/ML IV SOLN
20.0000 mL | Freq: Once | INTRAVENOUS | Status: AC | PRN
  Administered 2015-08-05: 17 mL via INTRAVENOUS

## 2015-08-06 ENCOUNTER — Other Ambulatory Visit: Payer: Self-pay | Admitting: Family Medicine

## 2015-08-06 DIAGNOSIS — R921 Mammographic calcification found on diagnostic imaging of breast: Secondary | ICD-10-CM

## 2015-08-13 ENCOUNTER — Ambulatory Visit
Admission: RE | Admit: 2015-08-13 | Discharge: 2015-08-13 | Disposition: A | Discharge status: Home / Self Care | Payer: BLUE CROSS/BLUE SHIELD | Source: Ambulatory Visit | Attending: Family Medicine | Admitting: Family Medicine

## 2015-08-13 ENCOUNTER — Other Ambulatory Visit: Payer: Self-pay | Admitting: Family Medicine

## 2015-08-13 DIAGNOSIS — R921 Mammographic calcification found on diagnostic imaging of breast: Secondary | ICD-10-CM | POA: Diagnosis not present

## 2015-08-13 NOTE — Telephone Encounter (Signed)
Can you call pt to verify that she is not taking and see why she stopped?

## 2015-08-13 NOTE — Telephone Encounter (Signed)
Pt has not had medication since 04/2015 when Rx expired, pls advise

## 2015-08-21 ENCOUNTER — Ambulatory Visit
Admission: RE | Admit: 2015-08-21 | Discharge: 2015-08-21 | Disposition: A | Discharge status: Home / Self Care | Payer: BLUE CROSS/BLUE SHIELD | Source: Ambulatory Visit | Attending: Family Medicine | Admitting: Family Medicine

## 2015-08-21 DIAGNOSIS — R921 Mammographic calcification found on diagnostic imaging of breast: Secondary | ICD-10-CM | POA: Diagnosis not present

## 2015-08-21 DIAGNOSIS — N6081 Other benign mammary dysplasias of right breast: Secondary | ICD-10-CM | POA: Diagnosis not present

## 2015-08-21 HISTORY — PX: BREAST BIOPSY: SHX20

## 2015-08-22 LAB — SURGICAL PATHOLOGY

## 2015-08-23 ENCOUNTER — Ambulatory Visit (INDEPENDENT_AMBULATORY_CARE_PROVIDER_SITE_OTHER): Payer: BLUE CROSS/BLUE SHIELD | Admitting: *Deleted

## 2015-08-23 DIAGNOSIS — Z23 Encounter for immunization: Secondary | ICD-10-CM | POA: Diagnosis not present

## 2015-09-12 ENCOUNTER — Other Ambulatory Visit (INDEPENDENT_AMBULATORY_CARE_PROVIDER_SITE_OTHER): Payer: BLUE CROSS/BLUE SHIELD

## 2015-09-12 DIAGNOSIS — E559 Vitamin D deficiency, unspecified: Secondary | ICD-10-CM

## 2015-09-12 DIAGNOSIS — Z1211 Encounter for screening for malignant neoplasm of colon: Secondary | ICD-10-CM | POA: Diagnosis not present

## 2015-09-12 DIAGNOSIS — Z0000 Encounter for general adult medical examination without abnormal findings: Secondary | ICD-10-CM

## 2015-09-12 DIAGNOSIS — E785 Hyperlipidemia, unspecified: Secondary | ICD-10-CM

## 2015-09-12 LAB — FECAL OCCULT BLOOD, IMMUNOCHEMICAL: Fecal Occult Bld: NEGATIVE

## 2015-09-13 ENCOUNTER — Telehealth: Payer: Self-pay

## 2015-09-13 ENCOUNTER — Encounter: Payer: Self-pay | Admitting: *Deleted

## 2015-09-13 NOTE — Telephone Encounter (Signed)
PLEASE NOTE: All timestamps contained within this report are represented as [place] Standard Time. CONFIDENTIALTY NOTICE: This fax transmission is intended only for the addressee. It contains information that is legally privileged, confidential or otherwise protected from use or disclosure. If you are not the intended recipient, you are strictly prohibited from reviewing, disclosing, copying using or disseminating any of this information or taking any action in reliance on or regarding this information. If you have received this fax in error, please notify us immediately by telephone so that we can arrange for its return to [place]. Phone: [phone], Toll-Free: [phone], Fax: [phone] Page: 1 of 1 Call Id: [id number] [place] Patient Name: Monica Carrillo Gender: Female DOB: 1955/08/23 Age: 60 Y 7 M 28 D Return Phone Number: [phone] (Primary) Address: City/State/Zip:  Client [place] - Client Client Site [facility] [place], [place] Type Call Caller Name Katharina Caper Phone Number [phone] Relationship To Patient Other Is this call to report lab results? No Call Type General Information Initial Comment Nina from [facility] [place] states she processed the wrong patients lab orders, they will need to be re-entered, CBC, CMP, Lip panel, TSH, Vit D, 25-Hydroxy General Information Type Message Only Nurse Assessment Guidelines Guideline Title Affirmed Question Affirmed Notes Nurse Date/Time (Eastern Time) Disp. Time Eilene Ghazi Time) Disposition Final User 09/12/2015 6:10:04 PM General Information Provided Yes [facility] Instructions Given Call Event Type User Date / Time Description

## 2015-09-13 NOTE — Telephone Encounter (Signed)
Spoke with Aniceto Boss in [facility] lab and she will contact lab and take care of. Note to [place] and FYI to Dr Deborra Medina.

## 2015-12-02 ENCOUNTER — Other Ambulatory Visit: Payer: Self-pay | Admitting: Family Medicine

## 2016-01-29 ENCOUNTER — Other Ambulatory Visit: Payer: Self-pay | Admitting: Family Medicine

## 2016-02-07 ENCOUNTER — Encounter: Payer: Self-pay | Admitting: Primary Care

## 2016-02-07 ENCOUNTER — Ambulatory Visit (INDEPENDENT_AMBULATORY_CARE_PROVIDER_SITE_OTHER): Payer: BLUE CROSS/BLUE SHIELD | Admitting: Primary Care

## 2016-02-07 VITALS — BP 116/74 | HR 74 | Temp 98.0°F | Ht 65.75 in | Wt 196.8 lb

## 2016-02-07 DIAGNOSIS — N390 Urinary tract infection, site not specified: Secondary | ICD-10-CM

## 2016-02-07 DIAGNOSIS — R319 Hematuria, unspecified: Secondary | ICD-10-CM

## 2016-02-07 LAB — POC URINALSYSI DIPSTICK (AUTOMATED)
Bilirubin, UA: NEGATIVE
Glucose, UA: NEGATIVE
Ketones, UA: NEGATIVE
Nitrite, UA: NEGATIVE
Spec Grav, UA: >=1.03
Urobilinogen, UA: NEGATIVE
pH, UA: 5.5

## 2016-02-07 MED ORDER — SULFAMETHOXAZOLE-TRIMETHOPRIM 800-160 MG PO TABS: 1.0000 | ORAL_TABLET | Freq: Two times a day (BID) | ORAL | Status: DC

## 2016-02-07 NOTE — Patient Instructions (Addendum)
You have a urinary tract infection that will need antibiotics to treat.  Start Bactrim DS (sulfamethoxazole/trimethoprim) tablets for urinary tract infection. Take 1 tablet by mouth twice daily for 5 days.  Ensure you are staying hydrated with water.  You may take AZO for any burning and/or pelvic discomfort.  It was a pleasure meeting you!  Urinary Tract Infection Urinary tract infections (UTIs) can develop anywhere along your urinary tract. Your urinary tract is your body's drainage system for removing wastes and extra water. Your urinary tract includes two kidneys, two ureters, a bladder, and a urethra. Your kidneys are a pair of bean-shaped organs. Each kidney is about the size of your fist. They are located below your ribs, one on each side of your spine. CAUSES Infections are caused by microbes, which are microscopic organisms, including fungi, viruses, and bacteria. These organisms are so small that they can only be seen through a microscope. Bacteria are the microbes that most commonly cause UTIs. SYMPTOMS  Symptoms of UTIs may vary by age and gender of the patient and by the location of the infection. Symptoms in young women typically include a frequent and intense urge to urinate and a painful, burning feeling in the bladder or urethra during urination. Older women and men are more likely to be tired, shaky, and weak and have muscle aches and abdominal pain. A fever may mean the infection is in your kidneys. Other symptoms of a kidney infection include pain in your back or sides below the ribs, nausea, and vomiting. DIAGNOSIS To diagnose a UTI, your caregiver will ask you about your symptoms. Your caregiver will also ask you to provide a urine sample. The urine sample will be tested for bacteria and white blood cells. White blood cells are made by your body to help fight infection. TREATMENT  Typically, UTIs can be treated with medication. Because most UTIs are caused by a bacterial  infection, they usually can be treated with the use of antibiotics. The choice of antibiotic and length of treatment depend on your symptoms and the type of bacteria causing your infection. HOME CARE INSTRUCTIONS  If you were prescribed antibiotics, take them exactly as your caregiver instructs you. Finish the medication even if you feel better after you have only taken some of the medication.  Drink enough water and fluids to keep your urine clear or pale yellow.  Avoid caffeine, tea, and carbonated beverages. They tend to irritate your bladder.  Empty your bladder often. Avoid holding urine for long periods of time.  Empty your bladder before and after sexual intercourse.  After a bowel movement, women should cleanse from front to back. Use each tissue only once. SEEK MEDICAL CARE IF:   You have back pain.  You develop a fever.  Your symptoms do not begin to resolve within 3 days. SEEK IMMEDIATE MEDICAL CARE IF:   You have severe back pain or lower abdominal pain.  You develop chills.  You have nausea or vomiting.  You have continued burning or discomfort with urination. MAKE SURE YOU:   Understand these instructions.  Will watch your condition.  Will get help right away if you are not doing well or get worse.   This information is not intended to replace advice given to you by your health care provider. Make sure you discuss any questions you have with your health care provider.   Document Released: 05/13/2005 Document Revised: 04/24/2015 Document Reviewed: 09/11/2011 Elsevier Interactive Patient Education [employer].

## 2016-02-07 NOTE — Progress Notes (Signed)
Subjective:    Patient ID: Monica Carrillo, female    DOB: May 19, 1956, 60 y.o.   MRN: [id number]  HPI  Monica Carrillo is a 60 year old female who presents today with a chief complaint of urinary frequency. She also reports dysuria, flank pain, and cloudy urine. Denies hematuria, fevers, nausea, diarrhea. Her symptoms began at 11 am yesterday morning. She's not taken anything OTC for her symptoms. She has no prior history of UTI's in the past.  Review of Systems  Constitutional: Negative for fever and chills.  Gastrointestinal: Negative for nausea and abdominal pain.  Genitourinary: Positive for dysuria, frequency and flank pain. Negative for hematuria, vaginal discharge and difficulty urinating.       Past Medical History  Diagnosis Date  . Anxiety   . Gallstones   . GERD (gastroesophageal reflux disease)   . History of thyroid nodule 2010  . History of MRSA infection 2007  . Hiatal hernia 2010  . Lung nodule     needs follow up in 05/2013  . Hyperlipemia      Social History   Social History  . Marital Status: Married    Spouse Name: N/A  . Number of Children: 2  . Years of Education: N/A   Occupational History  . Retired    Social History Main Topics  . Smoking status: Former Smoker -- 5 years    Types: Cigarettes    Quit date: 08/17/1977  . Smokeless tobacco: Never Used     Comment: as a teenager  . Alcohol Use: 0.0 oz/week    0 Standard drinks or equivalent per week     Comment: occ. wine or beer  . Drug Use: No  . Sexual Activity: Not on file   Other Topics Concern  . Not on file   Social History Narrative   Married.  Two children, 5 grandchildren.   Son, daughter in law and two grand daughters live in [place].      Recently moved to [place] from [place].   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    Past Surgical History  Procedure Laterality Date  . Cesarean section      x2  . Tonsillectomy    . Breast excisional biopsy Left  1994    negative  . Breast biopsy Right 08/21/2015    Family History  Problem Relation Age of Onset  . Hypertension Mother   . Kidney disease Brother   . Esophageal cancer Maternal Grandfather   . Heart disease Maternal Grandmother   . Irritable bowel syndrome Father   . Breast cancer Other     No Known Allergies  Current Outpatient Prescriptions on File Prior to Visit  Medication Sig Dispense Refill  . DULoxetine (CYMBALTA) 60 MG capsule TAKE 1 CAPSULE (60 MG TOTAL) BY MOUTH DAILY. 90 capsule 1  . esomeprazole (NEXIUM) 40 MG capsule Take 40 mg by mouth daily before breakfast.    . ranitidine (ZANTAC) 150 MG tablet TAKE 1 TABLET (150 MG TOTAL) BY MOUTH AT BEDTIME. 30 tablet 4  . retapamulin (ALTABAX) 1 % ointment Apply topically 2 (two) times daily. 15 g 0  . valACYclovir (VALTREX) 1000 MG tablet Take 1 tablet (1,000 mg total) by mouth 3 (three) times daily. 21 tablet 0   No current facility-administered medications on file prior to visit.    BP 116/74 mmHg  Pulse 74  Temp(Src) 98 F (36.7 C) (Oral)  Ht 5' 5.75" (1.67 m)  Wt  196 lb 12.8 oz (89.268 kg)  BMI 32.01 kg/m2  SpO2 96%    Objective:   Physical Exam  Constitutional: She appears well-nourished.  Cardiovascular: Normal rate and regular rhythm.   Pulmonary/Chest: Effort normal and breath sounds normal.  Abdominal: Soft. There is no CVA tenderness.  Skin: Skin is warm and dry.          Assessment & Plan:  Urinary Frequency:  Also with dysuria, cloudy urine, flank pain. UA today: 3+ leuks, 1+ blood, no nitrites. Culture sent. Rx for bactrim DS 5 day course provided. Discussed to increase consumption of water, AZO PRN. Follow up PRN.

## 2016-02-07 NOTE — Progress Notes (Signed)
Pre visit review using our clinic review tool, if applicable. No additional management support is needed unless otherwise documented below in the visit note. 

## 2016-02-07 NOTE — Addendum Note (Signed)
Addended by: Jacqualin Combes on: 02/07/2016 01:51 PM   Modules accepted: Orders, SmartSet

## 2016-02-10 ENCOUNTER — Telehealth: Payer: Self-pay

## 2016-02-10 LAB — URINE CULTURE: Colony Count: >=100000

## 2016-02-10 NOTE — Telephone Encounter (Signed)
PLEASE NOTE: All timestamps contained within this report are represented as [place] Standard Time. CONFIDENTIALTY NOTICE: This fax transmission is intended only for the addressee. It contains information that is legally privileged, confidential or otherwise protected from use or disclosure. If you are not the intended recipient, you are strictly prohibited from reviewing, disclosing, copying using or disseminating any of this information or taking any action in reliance on or regarding this information. If you have received this fax in error, please notify us immediately by telephone so that we can arrange for its return to [place]. Phone: [phone], Toll-Free: [phone], Fax: [phone] Page: 1 of 2 Call Id: [id number] [place] Patient Name: Monica Carrillo Gender: Female DOB: [birth year] Age: 60 Y 9 M 25 D Return Phone Number: [phone] (Primary) Address: City/State/Zip: [place] Client [place] Night - Client Client Site [facility] Physician Alma Friendly - NP Contact Type Call Who Is Calling Patient / Member / Family / Caregiver Call Type Triage / Clinical Caller Name Nekisha Relationship To Patient Self Return Phone Number [phone] (Primary) Chief Complaint Urination Frequency Reason for Call Symptomatic / Request for [place] states she in office today and was started on Bactrim for UTI. She suggested OTC AZO also for urgency, however, the urgency symptoms are worse and driving patient crazy. How long would this AZO take to kick in? PreDisposition InappropriateToAsk Translation No Nurse Assessment Nurse: Richardson Landry, RN, Aldona Bar Date/Time (Eastern Time): 02/07/2016 4:45:02 PM Confirm and document reason for call. If symptomatic, describe symptoms. You must click the next button to save  text entered. ---Caller states she was in the office today and started Bactrim for UTI. Dr suggested AZO for urgency, caller states she bought the AZO [facility] 2 caplets daily. Caller has taken 2 caplets and sx are not improving. Caller states urgency is worse. Has the patient traveled out of the country within the last 30 days? ---No Does the patient have any new or worsening symptoms? ---Yes Will a triage be completed? ---Yes Related visit to physician within the last 2 weeks? ---Yes Does the PT have any chronic conditions? (i.e. diabetes, asthma, etc.) ---No Is this a behavioral health or substance abuse call? ---No Guidelines Guideline Title Affirmed Question Affirmed Notes Nurse Date/Time Eilene Ghazi Time) Urinary Tract Infection on Antibiotic Follow-up Call - Female [1] Taking antibiotic < 72 hours (3 days) for UTI AND [2] painful urination or frequency not improved Richardson Landry, RN, Aldona Bar 02/07/2016 4:47:26 PM PLEASE NOTE: All timestamps contained within this report are represented as [place] Standard Time. CONFIDENTIALTY NOTICE: This fax transmission is intended only for the addressee. It contains information that is legally privileged, confidential or otherwise protected from use or disclosure. If you are not the intended recipient, you are strictly prohibited from reviewing, disclosing, copying using or disseminating any of this information or taking any action in reliance on or regarding this information. If you have received this fax in error, please notify us immediately by telephone so that we can arrange for its return to [place]. Phone: [phone], Toll-Free: [phone], Fax: [phone] Page: 2 of 2 Call Id: [id number] [place]. Time Eilene Ghazi Time) Disposition Final User 02/07/2016 4:51:48 PM Home Care Yes Richardson Landry, RN, Aldona Bar Caller Understands: Yes Disagree/Comply: Comply Care Advice Given Per Guideline HOME CARE: You should be able to treat this at  home. OTC PHENAZOPYRIDINE FOR SEVERE DYSURIA AND FREQUENCY *  [place]: Phenazopyridine Ambulance person) is available OTC. Dosage is 2 pills by mouth three times a day. FLUIDS: Drink extra fluids. Drink 8-10 glasses of liquids a day. (Reason: to produce a dilute, non-irritating urine.) CALL BACK IF: * Fever lasts over 24 hours on antibiotics * Pain does not improve by day 4 on antibiotics * Urine symptoms do not improve by day 4 on antibiotics * You become worse. CARE ADVICE given per Urinary Tract Infection on Antibiotic Follow-Up Call, Female (Adult) guideline.

## 2016-02-10 NOTE — Telephone Encounter (Signed)
I spoke with pt;pt is feeling better now and nothing further needed at this time; urgency symptoms gone. Pt continuing abx for 2 more days.

## 2016-02-10 NOTE — Telephone Encounter (Signed)
Noted. Antibiotic appropriate based off of culture report.

## 2016-02-28 ENCOUNTER — Other Ambulatory Visit: Payer: Self-pay | Admitting: Family Medicine

## 2016-03-03 ENCOUNTER — Encounter: Payer: Self-pay | Admitting: Family Medicine

## 2016-03-03 ENCOUNTER — Other Ambulatory Visit: Payer: Self-pay | Admitting: Family Medicine

## 2016-03-06 ENCOUNTER — Other Ambulatory Visit: Payer: Self-pay | Admitting: Internal Medicine

## 2016-03-06 DIAGNOSIS — B029 Zoster without complications: Secondary | ICD-10-CM

## 2016-03-06 MED ORDER — VALACYCLOVIR HCL 1 G PO TABS: 1000.0000 mg | ORAL_TABLET | Freq: Three times a day (TID) | ORAL | Status: DC

## 2016-03-28 ENCOUNTER — Other Ambulatory Visit: Payer: Self-pay | Admitting: Family Medicine

## 2016-07-15 ENCOUNTER — Encounter: Payer: Self-pay | Admitting: Family Medicine

## 2016-07-15 ENCOUNTER — Ambulatory Visit (INDEPENDENT_AMBULATORY_CARE_PROVIDER_SITE_OTHER): Payer: BLUE CROSS/BLUE SHIELD | Admitting: Family Medicine

## 2016-07-15 DIAGNOSIS — J019 Acute sinusitis, unspecified: Secondary | ICD-10-CM | POA: Diagnosis not present

## 2016-07-15 MED ORDER — AMOXICILLIN-POT CLAVULANATE 875-125 MG PO TABS: 1.0000 | ORAL_TABLET | Freq: Two times a day (BID) | ORAL | 0 refills | Status: DC

## 2016-07-15 NOTE — Progress Notes (Signed)
Subjective:  Patient ID: Monica Carrillo, female    DOB: 11-Oct-1955  Age: 60 y.o. MRN: [id number]  CC: URI symptoms  HPI:  60 year old female presents for acute visit.  Patient states that she's not been feeling well for the past 2 weeks. She states that she initially developed common cold symptoms. She had some subsequent improvement. However, she subsequent worsened this past Saturday. She's developed sore throat, sinus pressure and congestion. He's also had some feelings of dental pain. No associated fever. She's tried some over-the-counter medications with no relief. No known exacerbating factors. No other complaints or issues at this time.  Social Hx   Social History   Social History  . Marital status: Married    Spouse name: N/A  . Number of children: 2  . Years of education: N/A   Occupational History  . Retired    Social History Main Topics  . Smoking status: Former Smoker    Years: 5.00    Types: Cigarettes    Quit date: 08/17/1977  . Smokeless tobacco: Never Used     Comment: as a teenager  . Alcohol use 0.0 oz/week     Comment: occ. wine or beer  . Drug use: No  . Sexual activity: Not Asked   Other Topics Concern  . None   Social History Narrative   Married.  Two children, 5 grandchildren.   Son, daughter in law and two grand daughters live in [place].      Recently moved to [place] from [place].   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    Review of Systems  Constitutional: Positive for fatigue. Negative for fever.  HENT: Positive for congestion, sinus pain and sinus pressure.    Objective:  BP 100/65 (BP Location: Left Arm, Patient Position: Sitting, Cuff Size: Large)   Pulse 76   Temp 98.5 F (36.9 C) (Oral)   Resp 14   Wt 210 lb (95.3 kg)   SpO2 97%   BMI 34.15 kg/m   BP/Weight 07/15/2016 02/07/2016 [id number]  Systolic BP [id number]  Diastolic BP 65 74 78  Wt. (Lbs) 210 196.8 192.25  BMI 34.15 32.01 31.27    Physical Exam  Constitutional: She is oriented to person, place, and time. She appears well-developed. No distress.  HENT:  Mouth/Throat: Oropharynx is clear and moist.  Mild maxillary sinus tenderness to palpation.  Neck: Neck supple.  Cardiovascular: Normal rate and regular rhythm.   Lymphadenopathy:    She has no cervical adenopathy.  Neurological: She is alert and oriented to person, place, and time.  Psychiatric: She has a normal mood and affect.  Vitals reviewed.  Lab Results  Component Value Date   WBC 9.4 07/01/2015   HGB 14.3 07/01/2015   HCT 42.9 07/01/2015   PLT 341.0 07/01/2015   GLUCOSE 72 07/01/2015   CHOL 210 (H) 07/01/2015   TRIG 109.0 07/01/2015   HDL 58.90 07/01/2015   LDLDIRECT 134.8 05/05/2013   LDLCALC 130 (H) 07/01/2015   ALT 27 07/01/2015   AST 15 07/01/2015   NA 142 07/01/2015   K 4.5 07/01/2015   CL 103 07/01/2015   CREATININE 0.74 07/01/2015   BUN 17 07/01/2015   CO2 32 07/01/2015   TSH 0.81 07/01/2015    Assessment & Plan:   Problem List Items Addressed This Visit    Acute sinusitis    New acute problem. Treating with Augmentin.       Relevant Medications  amoxicillin-clavulanate (AUGMENTIN) 875-125 MG tablet      Meds ordered this encounter  Medications  . amoxicillin-clavulanate (AUGMENTIN) 875-125 MG tablet    Sig: Take 1 tablet by mouth 2 (two) times daily.    Dispense:  20 tablet    Refill:  0    Follow-up: PRN  [place]

## 2016-07-15 NOTE — Patient Instructions (Signed)
Antibiotic as prescribed.  Follow up closely with your PCP.  Take care  Dr. Lacinda Axon

## 2016-07-15 NOTE — Assessment & Plan Note (Signed)
New acute problem. Treating with Augmentin. 

## 2016-08-07 ENCOUNTER — Other Ambulatory Visit: Payer: Self-pay | Admitting: Family Medicine

## 2016-09-05 ENCOUNTER — Other Ambulatory Visit: Payer: Self-pay | Admitting: Family Medicine

## 2016-09-07 NOTE — Telephone Encounter (Signed)
Pt has not had f/u since 06/2015 CPE. pls advise

## 2016-10-01 ENCOUNTER — Other Ambulatory Visit: Payer: Self-pay | Admitting: Family Medicine

## 2016-10-01 ENCOUNTER — Ambulatory Visit (INDEPENDENT_AMBULATORY_CARE_PROVIDER_SITE_OTHER): Payer: BLUE CROSS/BLUE SHIELD | Admitting: Family Medicine

## 2016-10-01 ENCOUNTER — Other Ambulatory Visit (HOSPITAL_COMMUNITY)
Admission: RE | Admit: 2016-10-01 | Discharge: 2016-10-01 | Disposition: A | Discharge status: Home / Self Care | Payer: BLUE CROSS/BLUE SHIELD | Source: Ambulatory Visit | Attending: Family Medicine | Admitting: Family Medicine

## 2016-10-01 ENCOUNTER — Encounter: Payer: Self-pay | Admitting: Family Medicine

## 2016-10-01 VITALS — BP 112/76 | HR 78 | Temp 97.4°F | Ht 65.5 in | Wt 209.5 lb

## 2016-10-01 DIAGNOSIS — K219 Gastro-esophageal reflux disease without esophagitis: Secondary | ICD-10-CM

## 2016-10-01 DIAGNOSIS — Z1151 Encounter for screening for human papillomavirus (HPV): Secondary | ICD-10-CM | POA: Insufficient documentation

## 2016-10-01 DIAGNOSIS — E785 Hyperlipidemia, unspecified: Secondary | ICD-10-CM

## 2016-10-01 DIAGNOSIS — F419 Anxiety disorder, unspecified: Secondary | ICD-10-CM | POA: Diagnosis not present

## 2016-10-01 DIAGNOSIS — Z0000 Encounter for general adult medical examination without abnormal findings: Secondary | ICD-10-CM | POA: Diagnosis not present

## 2016-10-01 DIAGNOSIS — Z1231 Encounter for screening mammogram for malignant neoplasm of breast: Secondary | ICD-10-CM

## 2016-10-01 DIAGNOSIS — Z01419 Encounter for gynecological examination (general) (routine) without abnormal findings: Secondary | ICD-10-CM | POA: Insufficient documentation

## 2016-10-01 DIAGNOSIS — Z1211 Encounter for screening for malignant neoplasm of colon: Secondary | ICD-10-CM

## 2016-10-01 LAB — CBC WITH DIFFERENTIAL/PLATELET
BASOS ABS: 0 10*3/uL (ref 0.0–0.1)
Basophils Relative: 0.8 % (ref 0.0–3.0)
Eosinophils Absolute: 0.2 10*3/uL (ref 0.0–0.7)
Eosinophils Relative: 4.9 % (ref 0.0–5.0)
HEMATOCRIT: 42.4 % (ref 36.0–46.0)
Hemoglobin: 14.5 g/dL (ref 12.0–15.0)
LYMPHS PCT: 37.4 % (ref 12.0–46.0)
Lymphs Abs: 1.4 10*3/uL (ref 0.7–4.0)
MCHC: 34.2 g/dL (ref 30.0–36.0)
MCV: 91.2 fl (ref 78.0–100.0)
MONOS PCT: 8.8 % (ref 3.0–12.0)
Monocytes Absolute: 0.3 10*3/uL (ref 0.1–1.0)
Neutro Abs: 1.8 10*3/uL (ref 1.4–7.7)
Neutrophils Relative %: 48.1 % (ref 43.0–77.0)
PLATELETS: 307 10*3/uL (ref 150.0–400.0)
RBC: 4.65 Mil/uL (ref 3.87–5.11)
RDW: 13.7 % (ref 11.5–15.5)
WBC: 3.7 10*3/uL — ABNORMAL LOW (ref 4.0–10.5)

## 2016-10-01 LAB — COMPREHENSIVE METABOLIC PANEL
ALBUMIN: 4.4 g/dL (ref 3.5–5.2)
ALK PHOS: 49 U/L (ref 39–117)
ALT: 25 U/L (ref 0–35)
AST: 24 U/L (ref 0–37)
BUN: 13 mg/dL (ref 6–23)
CALCIUM: 9.3 mg/dL (ref 8.4–10.5)
CO2: 31 meq/L (ref 19–32)
Chloride: 105 mEq/L (ref 96–112)
Creatinine, Ser: 0.86 mg/dL (ref 0.40–1.20)
GFR: 71.92 mL/min (ref >60.00)
Glucose, Bld: 86 mg/dL (ref 70–99)
Potassium: 4.5 mEq/L (ref 3.5–5.1)
Sodium: 139 mEq/L (ref 135–145)
TOTAL PROTEIN: 7.2 g/dL (ref 6.0–8.3)
Total Bilirubin: 0.4 mg/dL (ref 0.2–1.2)

## 2016-10-01 LAB — LIPID PANEL
CHOLESTEROL: 231 mg/dL — AB (ref 0–200)
HDL: 64.9 mg/dL (ref >39.00)
LDL Cholesterol: 144 mg/dL — ABNORMAL HIGH (ref 0–99)
NonHDL: 165.92
TRIGLYCERIDES: 111 mg/dL (ref 0.0–149.0)
Total CHOL/HDL Ratio: 4
VLDL: 22.2 mg/dL (ref 0.0–40.0)

## 2016-10-01 LAB — TSH: TSH: 1.36 u[IU]/mL (ref 0.35–4.50)

## 2016-10-01 MED ORDER — DULOXETINE HCL 60 MG PO CPEP: 60.0000 mg | ORAL_CAPSULE | Freq: Every day | ORAL | 2 refills | Status: DC

## 2016-10-01 MED ORDER — RANITIDINE HCL 150 MG PO TABS: ORAL_TABLET | ORAL | 3 refills | Status: DC

## 2016-10-01 NOTE — Progress Notes (Signed)
Subjective:   Patient ID: Monica Carrillo, female    DOB: 05/30/1956, 61 y.o.   MRN: [id number]  Monica Carrillo is a pleasant 61 y.o. year old female who presents to clinic today with Annual Exam (with pap)  on 10/01/2016  HPI:  Colonoscopy 2009 Neg IFOB 09/12/15  Mammogram 08/22/15 Last pap smear 06/19/14- done by me.  No h/o abnormal pap smears.  Last period was 2015- no h/o postmenopausal bleeding.  GERD- on Nexium 40 mg daily.  In 2008- UGI series showed GERD with hiatal hernia. Feels her symptoms are pretty well controlled with Nexium.  Anxiety- has been quite well controlled on Cymbalta 60 mg daily.  Denies any anxiety or depression.   H/o HLD- due for labs. Lab Results  Component Value Date   CHOL 210 (H) 07/01/2015   HDL 58.90 07/01/2015   LDLCALC 130 (H) 07/01/2015   LDLDIRECT 134.8 05/05/2013   TRIG 109.0 07/01/2015   CHOLHDL 4 07/01/2015   Lab Results  Component Value Date   CREATININE 0.74 07/01/2015   Lab Results  Component Value Date   WBC 9.4 07/01/2015   HGB 14.3 07/01/2015   HCT 42.9 07/01/2015   MCV 94.9 07/01/2015   PLT 341.0 07/01/2015   Lab Results  Component Value Date   TSH 0.81 07/01/2015   Current Outpatient Prescriptions on File Prior to Visit  Medication Sig Dispense Refill  . esomeprazole (NEXIUM) 40 MG capsule Take 40 mg by mouth daily before breakfast.     No current facility-administered medications on file prior to visit.     No Known Allergies  Past Medical History:  Diagnosis Date  . Anxiety   . Gallstones   . GERD (gastroesophageal reflux disease)   . Hiatal hernia 2010  . History of MRSA infection 2007  . History of thyroid nodule 2010  . Hyperlipemia   . Lung nodule    needs follow up in 05/2013    Past Surgical History:  Procedure Laterality Date  . BREAST BIOPSY Right 08/21/2015  . BREAST EXCISIONAL BIOPSY Left 1994   negative  . CESAREAN SECTION     x2  . TONSILLECTOMY      Family History  Problem  Relation Age of Onset  . Hypertension Mother   . Kidney disease Brother   . Esophageal cancer Maternal Grandfather   . Heart disease Maternal Grandmother   . Irritable bowel syndrome Father   . Breast cancer Other     Social History   Social History  . Marital status: Married    Spouse name: N/A  . Number of children: 2  . Years of education: N/A   Occupational History  . Retired    Social History Main Topics  . Smoking status: Former Smoker    Years: 5.00    Types: Cigarettes    Quit date: 08/17/1977  . Smokeless tobacco: Never Used     Comment: as a teenager  . Alcohol use 0.0 oz/week     Comment: occ. wine or beer  . Drug use: No  . Sexual activity: Not on file   Other Topics Concern  . Not on file   Social History Narrative   Married.  Two children, 5 grandchildren.   Son, daughter in law and two grand daughters live in [place].      Recently moved to [place] from [place].   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   The PMH, PSH, Social History, Family History,  Medications, and allergies have been reviewed in [facility], and have been updated if relevant.    Review of Systems  Constitutional: Negative.   HENT: Negative.   Eyes: Negative.   Respiratory: Negative.   Cardiovascular: Negative.   Gastrointestinal: Negative.   Endocrine: Negative.   Genitourinary: Negative.   Musculoskeletal: Negative.   Skin: Negative.   Allergic/Immunologic: Negative.   Neurological: Negative.   Hematological: Negative.   Psychiatric/Behavioral: Negative.   All other systems reviewed and are negative.      Objective:    BP 112/76   Pulse 78   Temp 97.4 F (36.3 C) (Oral)   Ht 5' 5.5" (1.664 m)   Wt 209 lb 8 oz (95 kg)   SpO2 97%   BMI 34.33 kg/m  Wt Readings from Last 3 Encounters:  10/01/16 209 lb 8 oz (95 kg)  07/15/16 210 lb (95.3 kg)  02/07/16 196 lb 12.8 oz (89.3 kg)     Physical Exam   General:  Well-developed,well-nourished,in  no acute distress; alert,appropriate and cooperative throughout examination Head:  normocephalic and atraumatic.   Eyes:  vision grossly intact, PERRL Ears:  R ear normal and L ear normal externally, TMs clear bilaterally Nose:  no external deformity.   Mouth:  good dentition.   Neck:  No deformities, masses, or tenderness noted. Breasts:  No mass, nodules, thickening, tenderness, bulging, retraction, inflamation, nipple discharge or skin changes noted.   Lungs:  Normal respiratory effort, chest expands symmetrically. Lungs are clear to auscultation, no crackles or wheezes. Heart:  Normal rate and regular rhythm. S1 and S2 normal without gallop, murmur, click, rub or other extra sounds. Abdomen:  Bowel sounds positive,abdomen soft and non-tender without masses, organomegaly or hernias noted. Rectal:  no external abnormalities.   Genitalia:  Pelvic Exam:        External: normal female genitalia without lesions or masses        Vagina: normal without lesions or masses        Cervix: normal without lesions or masses        Adnexa: normal bimanual exam without masses or fullness        Uterus: normal by palpation        Pap smear: performed Msk:  No deformity or scoliosis noted of thoracic or lumbar spine.   Extremities:  No clubbing, cyanosis, edema, or deformity noted with normal full range of motion of all joints.   Neurologic:  alert & oriented X3 and gait normal.   Skin:  Intact without suspicious lesions or rashes Cervical Nodes:  No lymphadenopathy noted Axillary Nodes:  No palpable lymphadenopathy Psych:  Cognition and judgment appear intact. Alert and cooperative with normal attention span and concentration. No apparent delusions, illusions, hallucinations       Assessment & Plan:   Routine general medical examination at a health care facility - Plan: CBC with Differential/Platelet, Comprehensive metabolic panel, Lipid panel, TSH, Fecal occult blood,  imunochemical  Hyperlipidemia, unspecified hyperlipidemia type  Gastroesophageal reflux disease, esophagitis presence not specified  Anxiety No Follow-up on file.

## 2016-10-01 NOTE — Assessment & Plan Note (Signed)
Well controlled on current dose of cymbalta. No changes made today.

## 2016-10-01 NOTE — Assessment & Plan Note (Addendum)
Reviewed preventive care protocols, scheduled due services, and updated immunizations Discussed nutrition, exercise, diet, and healthy lifestyle.  Pap smear done today.  Pt to call to schedule mammogram.  Orders Placed This Encounter  Procedures  . Fecal occult blood, imunochemical  . CBC with Differential/Platelet  . Comprehensive metabolic panel  . Lipid panel  . TSH

## 2016-10-01 NOTE — Addendum Note (Signed)
Addended by: Modena Nunnery on: 10/01/2016 01:08 PM   Modules accepted: Orders

## 2016-10-01 NOTE — Patient Instructions (Signed)
Great to see you. We will call you with your lab results and you can view them online.  

## 2016-10-01 NOTE — Addendum Note (Signed)
Addended by: Ellamae Sia on: 10/01/2016 12:47 PM   Modules accepted: Orders

## 2016-10-01 NOTE — Progress Notes (Signed)
Pre visit review using our clinic review tool, if applicable. No additional management support is needed unless otherwise documented below in the visit note. 

## 2016-10-02 LAB — CYTOLOGY - PAP
Diagnosis: NEGATIVE
HPV: NOT DETECTED

## 2016-10-05 ENCOUNTER — Encounter: Payer: Self-pay | Admitting: *Deleted

## 2016-10-19 ENCOUNTER — Ambulatory Visit
Admission: RE | Admit: 2016-10-19 | Discharge: 2016-10-19 | Disposition: A | Discharge status: Home / Self Care | Payer: BLUE CROSS/BLUE SHIELD | Source: Ambulatory Visit | Attending: Family Medicine | Admitting: Family Medicine

## 2016-10-19 DIAGNOSIS — Z1231 Encounter for screening mammogram for malignant neoplasm of breast: Secondary | ICD-10-CM | POA: Insufficient documentation

## 2016-10-20 ENCOUNTER — Encounter: Payer: Self-pay | Admitting: Family Medicine

## 2016-10-28 ENCOUNTER — Other Ambulatory Visit (INDEPENDENT_AMBULATORY_CARE_PROVIDER_SITE_OTHER): Payer: BLUE CROSS/BLUE SHIELD

## 2016-10-28 DIAGNOSIS — Z1211 Encounter for screening for malignant neoplasm of colon: Secondary | ICD-10-CM

## 2016-10-28 LAB — FECAL OCCULT BLOOD, IMMUNOCHEMICAL: FECAL OCCULT BLD: NEGATIVE

## 2016-12-03 ENCOUNTER — Encounter: Payer: Self-pay | Admitting: Internal Medicine

## 2016-12-03 ENCOUNTER — Ambulatory Visit (INDEPENDENT_AMBULATORY_CARE_PROVIDER_SITE_OTHER): Payer: BLUE CROSS/BLUE SHIELD | Admitting: Internal Medicine

## 2016-12-03 VITALS — BP 106/72 | HR 69 | Temp 97.9°F | Wt 206.0 lb

## 2016-12-03 DIAGNOSIS — R600 Localized edema: Secondary | ICD-10-CM | POA: Diagnosis not present

## 2016-12-03 NOTE — Patient Instructions (Signed)
Angioedema  Angioedema is sudden swelling in the body. The swelling can happen in any part of the body. It often happens on the skin and causes itchy, bumpy patches (hives) to form.  This condition may:  · Happen only one time.  · Happen more than one time. It may come back at random times.  · Keep coming back for a number of years. Someday it may stop coming back.    Follow these instructions at home:  · Take over-the-counter and prescription medicines only as told by your doctor.  · If you were given medicines for emergency allergy treatment, always carry them with you.  · Wear a medical bracelet as told by your doctor.  · Avoid the things that cause your attacks (triggers).  · If this condition was passed to you from your parents and you want to have kids, talk to your doctor. Your kids may also have this condition.  Contact a doctor if:  · You have another attack.  · Your attacks happen more often, even after you take steps to prevent them.  · This condition was passed to you by your parents and you want to have kids.  Get help right away if:  · Your mouth, tongue, or lips get very swollen.  · You have trouble breathing.  · You have trouble swallowing.  · You pass out (faint).  This information is not intended to replace advice given to you by your health care provider. Make sure you discuss any questions you have with your health care provider.  Document Released: 07/22/2009 Document Revised: 03/04/2016 Document Reviewed: 02/11/2016  Elsevier Interactive Patient Education © 2017 [employer]

## 2016-12-03 NOTE — Progress Notes (Signed)
Subjective:    Patient ID: Monica Carrillo, female    DOB: [birth year], 61 y.o.   MRN: [id number]  HPI  Pt presents to the clinic today with c/o swelling around her eyes. She reports this started 5 days ago. She wakes up with her eyes swollen, it gets better throughout the day. She denies eye pain, eye redness, runny nose, nasal congestion or swelling of the lips or throat. She denies new makeup or cosmetic products. She denies changes is diet or medications. She has tried cool compresses and Allegra with some relief.  Review of Systems      Past Medical History:  Diagnosis Date  . Anxiety   . Gallstones   . GERD (gastroesophageal reflux disease)   . Hiatal hernia 2010  . History of MRSA infection 2007  . History of thyroid nodule 2010  . Hyperlipemia   . Lung nodule    needs follow up in 05/2013    Current Outpatient Prescriptions  Medication Sig Dispense Refill  . DULoxetine (CYMBALTA) 60 MG capsule Take 1 capsule (60 mg total) by mouth daily. 90 capsule 2  . esomeprazole (NEXIUM) 40 MG capsule Take 40 mg by mouth daily before breakfast.    . fexofenadine (ALLEGRA) 180 MG tablet Take 180 mg by mouth daily.    . ranitidine (ZANTAC) 150 MG tablet TAKE 1 TABLET (150 MG TOTAL) BY MOUTH AT BEDTIME. 90 tablet 3   No current facility-administered medications for this visit.     No Known Allergies  Family History  Problem Relation Age of Onset  . Hypertension Mother   . Kidney disease Brother   . Esophageal cancer Maternal Grandfather   . Heart disease Maternal Grandmother   . Irritable bowel syndrome Father   . Breast cancer Other     Social History   Social History  . Marital status: Married    Spouse name: N/A  . Number of children: 2  . Years of education: N/A   Occupational History  . Retired    Social History Main Topics  . Smoking status: Former Smoker    Years: 5.00    Types: Cigarettes    Quit date: 08/17/1977  . Smokeless tobacco: Never Used   Comment: as a teenager  . Alcohol use 0.0 oz/week     Comment: occ. wine or beer  . Drug use: No  . Sexual activity: Not on file   Other Topics Concern  . Not on file   Social History Narrative   Married.  Two children, 5 grandchildren.   Son, daughter in law and two grand daughters live in [place].      Recently moved to [place] from [place].   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     Constitutional: Denies fever, malaise, fatigue, headache or abrupt weight changes.  HEENT: Pt reports swelling around her eyes. Denies eye pain, eye redness, ear pain, ringing in the ears, wax buildup, runny nose, nasal congestion, bloody nose, or sore throat. Respiratory: Denies difficulty breathing, shortness of breath, cough or sputum production.   Cardiovascular: Denies chest pain, chest tightness, palpitations or swelling in the hands or feet.    No other specific complaints in a complete review of systems (except as listed in HPI above).  Objective:   Physical Exam   BP 106/72   Pulse 69   Temp 97.9 F (36.6 C) (Oral)   Wt 206 lb (93.4 kg)   BMI 33.76 kg/m  Wt  Readings from Last 3 Encounters:  12/03/16 206 lb (93.4 kg)  10/01/16 209 lb 8 oz (95 kg)  07/15/16 210 lb (95.3 kg)    General: Appears her stated age, obese in NAD. Skin: Warm, dry and intact. HEENT: Head: normal shape and size; Left Eye: sclera white, no icterus, conjunctiva pink; Right Eye: sclera white, no icterus, conjunctiva erythematous with small round lesion near lateral edge'; Ears: Tm's gray and intact, normal light reflex; Nose: mucosa pink and moist, septum midline; Throat/Mouth: Teeth present, mucosa pink and moist, no exudate, lesions or ulcerations noted.  Neck:  No adenopathy noted.   BMET    Component Value Date/Time   NA 139 10/01/2016 1241   K 4.5 10/01/2016 1241   CL 105 10/01/2016 1241   CO2 31 10/01/2016 1241   GLUCOSE 86 10/01/2016 1241   BUN 13 10/01/2016 1241    CREATININE 0.86 10/01/2016 1241   CALCIUM 9.3 10/01/2016 1241    Lipid Panel     Component Value Date/Time   CHOL 231 (H) 10/01/2016 1241   TRIG 111.0 10/01/2016 1241   HDL 64.90 10/01/2016 1241   CHOLHDL 4 10/01/2016 1241   VLDL 22.2 10/01/2016 1241   LDLCALC 144 (H) 10/01/2016 1241    CBC    Component Value Date/Time   WBC 3.7 (L) 10/01/2016 1241   RBC 4.65 10/01/2016 1241   HGB 14.5 10/01/2016 1241   HCT 42.4 10/01/2016 1241   PLT 307.0 10/01/2016 1241   MCV 91.2 10/01/2016 1241   MCHC 34.2 10/01/2016 1241   RDW 13.7 10/01/2016 1241   LYMPHSABS 1.4 10/01/2016 1241   MONOABS 0.3 10/01/2016 1241   EOSABS 0.2 10/01/2016 1241   BASOSABS 0.0 10/01/2016 1241    Hgb A1C No results found for: HGBA1C          Assessment & Plan:   Periorbital Edema:  Likely r/t lesion in right conjunctiva..?viral Continue Allegra and cool compresses Start Benadryl at night 80 mg Depo IM today  Return precautions discussed Webb Silversmith, NP

## 2016-12-07 ENCOUNTER — Encounter: Payer: Self-pay | Admitting: Internal Medicine

## 2016-12-07 ENCOUNTER — Ambulatory Visit (INDEPENDENT_AMBULATORY_CARE_PROVIDER_SITE_OTHER): Payer: BLUE CROSS/BLUE SHIELD | Admitting: Internal Medicine

## 2016-12-07 VITALS — BP 104/68 | HR 64 | Temp 98.0°F | Wt 205.0 lb

## 2016-12-07 DIAGNOSIS — H029 Unspecified disorder of eyelid: Secondary | ICD-10-CM

## 2016-12-07 MED ORDER — ERYTHROMYCIN 2 % EX OINT: 1.0000 "application " | TOPICAL_OINTMENT | Freq: Two times a day (BID) | CUTANEOUS | 0 refills | Status: DC

## 2016-12-07 NOTE — Progress Notes (Signed)
Subjective:    Patient ID: Monica Carrillo, female    DOB: 1956-04-06, 61 y.o.   MRN: [id number]  HPI  Pt presents to the clinic today to follow up bilateral eye swelling. She was seen 4/19 for the same. She was found to have a ulceration of her right lower conjunctiva. She was advised to continue Allegra and cool compresses. She was also given a IM shot of Dep Medrol. She reports the shot helped the swelling. She now has what she feels like is a knot on her left upper lid and 2 ulcerations now in her right lower lid. She denies blurred vision or visual changes.  Review of Systems      Past Medical History:  Diagnosis Date  . Anxiety   . Gallstones   . GERD (gastroesophageal reflux disease)   . Hiatal hernia 2010  . History of MRSA infection 2007  . History of thyroid nodule 2010  . Hyperlipemia   . Lung nodule    needs follow up in 05/2013    Current Outpatient Prescriptions  Medication Sig Dispense Refill  . DULoxetine (CYMBALTA) 60 MG capsule Take 1 capsule (60 mg total) by mouth daily. 90 capsule 2  . esomeprazole (NEXIUM) 40 MG capsule Take 40 mg by mouth daily before breakfast.    . fexofenadine (ALLEGRA) 180 MG tablet Take 180 mg by mouth daily.    . ranitidine (ZANTAC) 150 MG tablet TAKE 1 TABLET (150 MG TOTAL) BY MOUTH AT BEDTIME. 90 tablet 3   No current facility-administered medications for this visit.     No Known Allergies  Family History  Problem Relation Age of Onset  . Hypertension Mother   . Kidney disease Brother   . Esophageal cancer Maternal Grandfather   . Heart disease Maternal Grandmother   . Irritable bowel syndrome Father   . Breast cancer Other     Social History   Social History  . Marital status: Married    Spouse name: N/A  . Number of children: 2  . Years of education: N/A   Occupational History  . Retired    Social History Main Topics  . Smoking status: Former Smoker    Years: 5.00    Types: Cigarettes    Quit date:  08/17/1977  . Smokeless tobacco: Never Used     Comment: as a teenager  . Alcohol use 0.0 oz/week     Comment: occ. wine or beer  . Drug use: No  . Sexual activity: Not on file   Other Topics Concern  . Not on file   Social History Narrative   Married.  Two children, 5 grandchildren.   Son, daughter in law and two grand daughters live in [place].      Recently moved to [place] from [place].   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     Constitutional: Denies fever, malaise, fatigue, headache or abrupt weight changes.  HEENT: Pt reports intermittent swelling around eyes. Denies eye pain, eye redness, ear pain, ringing in the ears, wax buildup, runny nose, nasal congestion, bloody nose, or sore throat. Skin: Pt reports lesions of eye lids. Denies redness, rashes.    No other specific complaints in a complete review of systems (except as listed in HPI above).  Objective:   Physical Exam   BP 104/68   Pulse 64   Temp 98 F (36.7 C) (Oral)   Wt 205 lb (93 kg)   SpO2 98%  BMI 33.59 kg/m  Wt Readings from Last 3 Encounters:  12/07/16 205 lb (93 kg)  12/03/16 206 lb (93.4 kg)  10/01/16 209 lb 8 oz (95 kg)    General: Appears her stated age, in NAD. HEENT: Right Eye: Conjunctiva erythematous. 2 small ulcerations noted near outer canthus. Small nodule palpated of left upper eyelid. Sclera white, no icterus. PERRLA. BMET    Component Value Date/Time   NA 139 10/01/2016 1241   K 4.5 10/01/2016 1241   CL 105 10/01/2016 1241   CO2 31 10/01/2016 1241   GLUCOSE 86 10/01/2016 1241   BUN 13 10/01/2016 1241   CREATININE 0.86 10/01/2016 1241   CALCIUM 9.3 10/01/2016 1241    Lipid Panel     Component Value Date/Time   CHOL 231 (H) 10/01/2016 1241   TRIG 111.0 10/01/2016 1241   HDL 64.90 10/01/2016 1241   CHOLHDL 4 10/01/2016 1241   VLDL 22.2 10/01/2016 1241   LDLCALC 144 (H) 10/01/2016 1241    CBC    Component Value Date/Time   WBC 3.7 (L)  10/01/2016 1241   RBC 4.65 10/01/2016 1241   HGB 14.5 10/01/2016 1241   HCT 42.4 10/01/2016 1241   PLT 307.0 10/01/2016 1241   MCV 91.2 10/01/2016 1241   MCHC 34.2 10/01/2016 1241   RDW 13.7 10/01/2016 1241   LYMPHSABS 1.4 10/01/2016 1241   MONOABS 0.3 10/01/2016 1241   EOSABS 0.2 10/01/2016 1241   BASOSABS 0.0 10/01/2016 1241    Hgb A1C No results found for: HGBA1C         Assessment & Plan:   Eyelid Lesion:  Will try Erythromycin Eye Ointment x 5 days Continue Allegra and cool compresses  If no improvement, follow up with PCP or optho Jeannia Tatro, NP

## 2016-12-15 ENCOUNTER — Ambulatory Visit: Payer: BLUE CROSS/BLUE SHIELD | Admitting: Family Medicine

## 2016-12-16 ENCOUNTER — Ambulatory Visit (INDEPENDENT_AMBULATORY_CARE_PROVIDER_SITE_OTHER)
Admission: RE | Admit: 2016-12-16 | Discharge: 2016-12-16 | Disposition: A | Discharge status: Home / Self Care | Payer: BLUE CROSS/BLUE SHIELD | Source: Ambulatory Visit | Attending: Family Medicine | Admitting: Family Medicine

## 2016-12-16 ENCOUNTER — Encounter: Payer: Self-pay | Admitting: Family Medicine

## 2016-12-16 ENCOUNTER — Ambulatory Visit (INDEPENDENT_AMBULATORY_CARE_PROVIDER_SITE_OTHER): Payer: BLUE CROSS/BLUE SHIELD | Admitting: Family Medicine

## 2016-12-16 VITALS — BP 110/70 | HR 71 | Temp 98.4°F | Ht 65.5 in | Wt 207.5 lb

## 2016-12-16 DIAGNOSIS — M25562 Pain in left knee: Secondary | ICD-10-CM | POA: Diagnosis not present

## 2016-12-16 MED ORDER — METHYLPREDNISOLONE ACETATE 40 MG/ML IJ SUSP
80.0000 mg | Freq: Once | INTRAMUSCULAR | Status: AC
  Administered 2016-12-16: 80 mg via INTRA_ARTICULAR

## 2016-12-16 NOTE — Progress Notes (Signed)
Dr. Frederico Hamman T. Leanard Dimaio, MD, [facility] [place], [address] Phone: [phone] Fax: [phone]  12/16/2016  Patient: Monica Carrillo, MRN: [id number], DOB: 02-Nov-1955, 61 y.o.  Primary Physician:  Arnette Norris, MD   Chief Complaint  Patient presents with  . Knee Pain   Subjective:   Monica Carrillo is a 61 y.o. very pleasant female patient who presents with the following:  L knee pain:  2 weeks ago, had a stretch session before a massage. L knee hurts off and on. Has some clocking. Hurting laterally.  Knee was forcefully flexed - pain the next day. Her knee was taken per report to the point of maximal flexion and then flexed further.  After that she had some significant pain. No significant knee injuries in the past.   On Monday, heard a huge crack and hurt really bad in the knee.  She has had minimal to mild effusion.  The pain essentially started approximately 24 hours after her initial flexion.  She has significant pain with flexion as well as rotational movements such as getting out of her car.  Past Medical History, Surgical History, Social History, Family History, Problem List, Medications, and Allergies have been reviewed and updated if relevant.  Patient Active Problem List   Diagnosis Date Noted  . Well woman exam with routine gynecological exam 01/17/2013  . HLD (hyperlipidemia) 01/17/2013  . Vitamin D deficiency 10/03/2012  . Anxiety   . Gallstones   . GERD (gastroesophageal reflux disease)   . Lung nodule     Past Medical History:  Diagnosis Date  . Anxiety   . Gallstones   . GERD (gastroesophageal reflux disease)   . Hiatal hernia 2010  . History of MRSA infection 2007  . History of thyroid nodule 2010  . Hyperlipemia   . Lung nodule    needs follow up in 05/2013    Past Surgical History:  Procedure Laterality Date  . BREAST BIOPSY Right 08/21/2015  . BREAST EXCISIONAL BIOPSY Left 1994    negative  . CESAREAN SECTION     x2  . TONSILLECTOMY      Social History   Social History  . Marital status: Married    Spouse name: N/A  . Number of children: 2  . Years of education: N/A   Occupational History  . Retired    Social History Main Topics  . Smoking status: Former Smoker    Years: 5.00    Types: Cigarettes    Quit date: 08/17/1977  . Smokeless tobacco: Never Used     Comment: as a teenager  . Alcohol use 0.0 oz/week     Comment: occ. wine or beer  . Drug use: No  . Sexual activity: Not on file   Other Topics Concern  . Not on file   Social History Narrative   Married.  Two children, 5 grandchildren.   Son, daughter in law and two grand daughters live in [place].      Recently moved to [place] from [place].   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    Family History  Problem Relation Age of Onset  . Hypertension Mother   . Kidney disease Brother   . Irritable bowel syndrome Father   . Breast cancer Other   . Esophageal cancer Maternal Grandfather   . Heart disease Maternal Grandmother     No Known Allergies  Medication list reviewed and  updated in full in [facility].  GEN: No fevers, chills. Nontoxic. Primarily MSK c/o today. MSK: Detailed in the HPI GI: tolerating PO intake without difficulty Neuro: No numbness, parasthesias, or tingling associated. Otherwise the pertinent positives of the ROS are noted above.   Objective:   BP 110/70   Pulse 71   Temp 98.4 F (36.9 C) (Oral)   Ht 5' 5.5" (1.664 m)   Wt 207 lb 8 oz (94.1 kg)   BMI 34.00 kg/m    GEN: WDWN, NAD, Non-toxic, Alert & Oriented x 3 HEENT: Atraumatic, Normocephalic.  Ears and Nose: No external deformity. EXTR: No clubbing/cyanosis/edema NEURO: Normal gait.  PSYCH: Normally interactive. Conversant. Not depressed or anxious appearing.  Calm demeanor.   Knee:  L Gait: Normal heel toe pattern, mild antalgia ROM: 0-110 Effusion:  mild Echymosis or edema: none Patellar tendon NT Painful PLICA: neg Patellar grind: negative Medial and lateral patellar facet loading: negative medial and lateral joint lines: medial joint line tenderness Mcmurray's pain Flexion-pinch pos Varus and valgus stress: stable Lachman: neg Ant and Post drawer: neg Hip abduction, IR, ER: WNL Hip flexion str: 5/5 Hip abd: 5/5 Quad: 5/5 VMO atrophy:No Hamstring concentric and eccentric: 5/5   Radiology: Dg Knee 4 Views W/patella Left  Result Date: 12/16/2016 CLINICAL DATA:  Left knee pain. EXAM: LEFT KNEE - COMPLETE 4+ VIEW COMPARISON:  None FINDINGS: No acute bony abnormality. Specifically, no fracture, subluxation, or dislocation. Soft tissues are intact. Joint spaces are maintained. No joint effusion. IMPRESSION: Negative. Electronically Signed   By: Rolm Baptise M.D.   On: 12/16/2016 10:54     Assessment and Plan:   Acute pain of left knee - Plan: DG Knee 4 Views W/Patella Left, methylPREDNISolone acetate (DEPO-MEDROL) injection 80 mg  By history and exam, this is most likely a meniscal tear.  At age 61, she may do okay with conservative management alone.  Recommended relative rest, continue with ice and anti-inflammatories.  We will also do an intra-articular injection in her knee.  Begin basic activities such as straight leg raises, knee extensions, riding and upright bicycle stationary.  Knee Injection, L Patient verbally consented to procedure. Risks (including potential rare risk of infection), benefits, and alternatives explained. Sterilely prepped with Chloraprep. Ethyl cholride used for anesthesia. 8 cc Marcaine 0.25%  mixed with 2 mL Depo-Medrol 40 mg injected using the anteromedial approach without difficulty. No complications with procedure and tolerated well. Patient had decreased pain post-injection.   Follow-up: if needed in 6 weeks  Meds ordered this encounter  Medications  . neomycin-polymyxin b-dexamethasone  (MAXITROL) 3.5-10000-0.1 OINT    Sig: APPLY A SMALL AMOUNT TO BOTH LIDS TWICE DAILY FOR 14 DAYS    Refill:  0  . methylPREDNISolone acetate (DEPO-MEDROL) injection 80 mg   Medications Discontinued During This Encounter  Medication Reason  . Erythromycin 2 % ointment Prescription never filled   Orders Placed This Encounter  Procedures  . DG Knee 4 Views W/Patella Left    Signed,  Rease Swinson T. Tavarus Poteete, MD   Allergies as of 12/16/2016   No Known Allergies     Medication List       Accurate as of 12/16/16 11:59 PM. Always use your most recent med list.          DULoxetine 60 MG capsule Commonly known as:  CYMBALTA Take 1 capsule (60 mg total) by mouth daily.   esomeprazole 40 MG capsule Commonly known as:  NEXIUM Take 40 mg by mouth  daily before breakfast.   fexofenadine 180 MG tablet Commonly known as:  ALLEGRA Take 180 mg by mouth daily.   neomycin-polymyxin b-dexamethasone 3.5-10000-0.1 Oint Commonly known as:  MAXITROL APPLY A SMALL AMOUNT TO BOTH LIDS TWICE DAILY FOR 14 DAYS   ranitidine 150 MG tablet Commonly known as:  ZANTAC TAKE 1 TABLET (150 MG TOTAL) BY MOUTH AT BEDTIME.

## 2016-12-16 NOTE — Progress Notes (Signed)
Pre visit review using our clinic review tool, if applicable. No additional management support is needed unless otherwise documented below in the visit note. 

## 2017-05-24 ENCOUNTER — Ambulatory Visit (INDEPENDENT_AMBULATORY_CARE_PROVIDER_SITE_OTHER): Payer: BLUE CROSS/BLUE SHIELD | Admitting: Family Medicine

## 2017-05-24 ENCOUNTER — Encounter: Payer: Self-pay | Admitting: Family Medicine

## 2017-05-24 DIAGNOSIS — M722 Plantar fascial fibromatosis: Secondary | ICD-10-CM | POA: Diagnosis not present

## 2017-05-24 MED ORDER — DULOXETINE HCL 60 MG PO CPEP: 60.0000 mg | ORAL_CAPSULE | Freq: Every day | ORAL | 3 refills | Status: DC

## 2017-05-24 NOTE — Progress Notes (Signed)
Subjective:   Patient ID: Monica Carrillo, female    DOB: Nov 26, 1955, 61 y.o.   MRN: [id number]  Monica Carrillo is a pleasant 61 y.o. year old female who presents to clinic today with Feet pain (Bilateral feet pain-on going for one month Left worse than right)  on 05/24/2017  HPI:  Bilateral heel pain- left > right x 1 month.  No known injury.  She did go from not walking to walking daily again right when this started.  No changes in foot wear.  First few steps of the day or steps after she has been laying down are the worst.  Has not really tried anything for it.   Current Outpatient Prescriptions on File Prior to Visit  Medication Sig Dispense Refill  . esomeprazole (NEXIUM) 40 MG capsule Take 40 mg by mouth daily before breakfast.    . fexofenadine (ALLEGRA) 180 MG tablet Take 180 mg by mouth daily.    . ranitidine (ZANTAC) 150 MG tablet TAKE 1 TABLET (150 MG TOTAL) BY MOUTH AT BEDTIME. 90 tablet 3   No current facility-administered medications on file prior to visit.     No Known Allergies  Past Medical History:  Diagnosis Date  . Anxiety   . Gallstones   . GERD (gastroesophageal reflux disease)   . Hiatal hernia 2010  . History of MRSA infection 2007  . History of thyroid nodule 2010  . Hyperlipemia   . Lung nodule    needs follow up in 05/2013    Past Surgical History:  Procedure Laterality Date  . BREAST BIOPSY Right 08/21/2015  . BREAST EXCISIONAL BIOPSY Left 1994   negative  . CESAREAN SECTION     x2  . TONSILLECTOMY      Family History  Problem Relation Age of Onset  . Hypertension Mother   . Kidney disease Brother   . Irritable bowel syndrome Father   . Breast cancer Other   . Esophageal cancer Maternal Grandfather   . Heart disease Maternal Grandmother     Social History   Social History  . Marital status: Married    Spouse name: N/A  . Number of children: 2  . Years of education: N/A   Occupational History  . Retired    Social  History Main Topics  . Smoking status: Former Smoker    Years: 5.00    Types: Cigarettes    Quit date: 08/17/1977  . Smokeless tobacco: Never Used     Comment: as a teenager  . Alcohol use 0.0 oz/week     Comment: occ. wine or beer  . Drug use: No  . Sexual activity: Not on file   Other Topics Concern  . Not on file   Social History Narrative   Married.  Two children, 5 grandchildren.   Son, daughter in law and two grand daughters live in [place].      Recently moved to [place] from [place].   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   The PMH, PSH, Social History, Family History, Medications, and allergies have been reviewed in [facility], and have been updated if relevant.   Review of Systems  Musculoskeletal: Positive for arthralgias.  All other systems reviewed and are negative.      Objective:    BP 122/76   Pulse 74   Temp 98.1 F (36.7 C) (Oral)   Resp (!) 99   Wt 212 lb (96.2 kg)   BMI 34.74 kg/m  Physical Exam  Constitutional: She is oriented to person, place, and time. She appears well-developed and well-nourished. No distress.  HENT:  Head: Normocephalic and atraumatic.  Eyes: Conjunctivae are normal.  Cardiovascular: Normal rate.   Pulmonary/Chest: Effort normal.  Musculoskeletal:       Feet:  Neurological: She is alert and oriented to person, place, and time. No cranial nerve deficit.  Skin: Skin is warm and dry. She is not diaphoretic.  Psychiatric: She has a normal mood and affect. Her behavior is normal. Judgment and thought content normal.  Nursing note and vitals reviewed.         Assessment & Plan:   Plantar fasciitis, bilateral No Follow-up on file.

## 2017-05-24 NOTE — Assessment & Plan Note (Signed)
New- discussed exercises and supportive care as per sports med advisor. Call or return to clinic prn if these symptoms worsen or fail to improve as anticipated. The patient indicates understanding of these issues and agrees with the plan.

## 2017-09-02 ENCOUNTER — Encounter: Payer: Self-pay | Admitting: Family Medicine

## 2017-09-26 ENCOUNTER — Other Ambulatory Visit: Payer: Self-pay | Admitting: Family Medicine

## 2017-09-27 ENCOUNTER — Encounter: Payer: Self-pay | Admitting: Family Medicine

## 2017-09-27 ENCOUNTER — Ambulatory Visit (INDEPENDENT_AMBULATORY_CARE_PROVIDER_SITE_OTHER): Payer: BLUE CROSS/BLUE SHIELD | Admitting: Family Medicine

## 2017-09-27 DIAGNOSIS — Z8614 Personal history of Methicillin resistant Staphylococcus aureus infection: Secondary | ICD-10-CM

## 2017-09-27 DIAGNOSIS — L0291 Cutaneous abscess, unspecified: Secondary | ICD-10-CM | POA: Insufficient documentation

## 2017-09-27 DIAGNOSIS — L02416 Cutaneous abscess of left lower limb: Secondary | ICD-10-CM

## 2017-09-27 MED ORDER — DOXYCYCLINE HYCLATE 100 MG PO TABS: 100.0000 mg | ORAL_TABLET | Freq: Two times a day (BID) | ORAL | 0 refills | Status: DC

## 2017-09-27 MED ORDER — RANITIDINE HCL 150 MG PO TABS: ORAL_TABLET | ORAL | 3 refills | Status: DC

## 2017-09-27 MED ORDER — MUPIROCIN CALCIUM 2 % EX CREA: 1.0000 "application " | TOPICAL_CREAM | Freq: Two times a day (BID) | CUTANEOUS | 0 refills | Status: DC

## 2017-09-27 NOTE — Progress Notes (Signed)
Subjective:   Patient ID: Monica Carrillo, female    DOB: 07/11/1956, 62 y.o.   MRN: [id number]  Monica Carrillo is a pleasant 62 y.o. year old female who presents to clinic today with MRSA Flare-Up (Patient is here today C/O another flare-up of MRSA.  She has had this for 11 years.  It is located on right upper inner thigh and started 1 week ago.  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  New painful mass on her right upper thigh.  Red/swollen, painful.  Current Outpatient Medications on File Prior to Visit  Medication Sig Dispense Refill  . DULoxetine (CYMBALTA) 60 MG capsule Take 1 capsule (60 mg total) by mouth daily. 90 capsule 3  . esomeprazole (NEXIUM) 40 MG capsule Take 40 mg by mouth daily before breakfast.    . fexofenadine (ALLEGRA) 180 MG tablet Take 180 mg by mouth daily.     No current facility-administered medications on file prior to visit.     No Known Allergies  Past Medical History:  Diagnosis Date  . Anxiety   . Gallstones   . GERD (gastroesophageal reflux disease)   . Hiatal hernia 2010  . History of MRSA infection 2007  . History of thyroid nodule 2010  . Hyperlipemia   . Lung nodule    needs follow up in 05/2013    Past Surgical History:  Procedure Laterality Date  . BREAST BIOPSY Right 08/21/2015  . BREAST EXCISIONAL BIOPSY Left 1994   negative  . CESAREAN SECTION     x2  . TONSILLECTOMY      Family History  Problem Relation Age of Onset  . Hypertension Mother   . Kidney disease Brother   . Irritable bowel syndrome Father   . Breast cancer Other   . Esophageal cancer Maternal Grandfather   . Heart disease Maternal Grandmother     Social History   Socioeconomic History  . Marital status: Married    Spouse name: Not on file  . Number of children: 2  . Years of education: Not on file  . Highest education level: Not on file  Social Needs  .  Financial resource strain: Not on file  . Food insecurity - worry: Not on file  . Food insecurity - inability: Not on file  . Transportation needs - medical: Not on file  . Transportation needs - non-medical: Not on file  Occupational History  . Occupation: Retired  Tobacco Use  . Smoking status: Former Smoker    Years: 5.00    Types: Cigarettes    Last attempt to quit: 08/17/1977    Years since quitting: 40.1  . Smokeless tobacco: Never Used  . Tobacco comment: as a teenager  Substance and Sexual Activity  . Alcohol use: Yes    Alcohol/week: 0.0 oz    Comment: occ. wine or beer  . Drug use: No  . Sexual activity: Not on file  Other Topics Concern  . Not on file  Social History Narrative   Married.  Two children, 5 grandchildren.   Son, daughter in law and two grand daughters live in [place].      Recently moved to [place] from [place].   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   The PMH, PSH, Social History, Family History, Medications, and allergies have been reviewed in [facility], and have been updated if relevant.   Review  of Systems  Constitutional: Negative.   Respiratory: Negative.   Cardiovascular: Negative.   Gastrointestinal: Negative.   Skin: Positive for color change and wound.  All other systems reviewed and are negative.      Objective:    BP 112/70 (BP Location: Left Arm, Patient Position: Sitting, Cuff Size: Normal)   Pulse 81   Temp 98.4 F (36.9 C) (Oral)   Ht 5' 5.5" (1.664 m)   Wt 213 lb 3.2 oz (96.7 kg)   SpO2 97%   BMI 34.94 kg/m    Physical Exam  Constitutional: She is oriented to person, place, and time. She appears well-developed and well-nourished. No distress.  HENT:  Head: Normocephalic and atraumatic.  Eyes: Conjunctivae are normal.  Neck: Normal range of motion.  Cardiovascular: Normal rate.  Pulmonary/Chest: Effort normal.  Musculoskeletal: Normal range of motion.  Neurological: She is alert and oriented to  person, place, and time. No cranial nerve deficit.  Skin: She is not diaphoretic.     Psychiatric: She has a normal mood and affect. Her behavior is normal. Judgment and thought content normal.  Nursing note and vitals reviewed.         Assessment & Plan:   Cutaneous abscess of left lower extremity No Follow-up on file.

## 2017-09-27 NOTE — Patient Instructions (Signed)
Great to see you.  Please take doxycyline as directed- 1 tablet twice daily for 7 days.  Please keep me updated.

## 2017-09-27 NOTE — Assessment & Plan Note (Signed)
New- Non fluctuant- not appropriate for I and D. Given h/o MRSA, treat with oral doxycyline 100 mg twice daily x 7 days. Continue warm compresses. Call or return to clinic prn if these symptoms worsen or fail to improve as anticipated. The patient indicates understanding of these issues and agrees with the plan.

## 2017-09-28 ENCOUNTER — Other Ambulatory Visit: Payer: Self-pay | Admitting: Family Medicine

## 2017-10-27 ENCOUNTER — Other Ambulatory Visit: Payer: Self-pay | Admitting: Family Medicine

## 2017-10-27 DIAGNOSIS — Z1231 Encounter for screening mammogram for malignant neoplasm of breast: Secondary | ICD-10-CM

## 2017-11-10 ENCOUNTER — Ambulatory Visit
Admission: RE | Admit: 2017-11-10 | Discharge: 2017-11-10 | Disposition: A | Discharge status: Home / Self Care | Payer: BLUE CROSS/BLUE SHIELD | Source: Ambulatory Visit | Attending: Family Medicine | Admitting: Family Medicine

## 2017-11-10 DIAGNOSIS — Z1231 Encounter for screening mammogram for malignant neoplasm of breast: Secondary | ICD-10-CM | POA: Diagnosis not present

## 2017-12-09 ENCOUNTER — Telehealth: Payer: Self-pay

## 2017-12-09 DIAGNOSIS — Z0000 Encounter for general adult medical examination without abnormal findings: Secondary | ICD-10-CM

## 2017-12-09 DIAGNOSIS — E559 Vitamin D deficiency, unspecified: Secondary | ICD-10-CM

## 2017-12-09 DIAGNOSIS — E785 Hyperlipidemia, unspecified: Secondary | ICD-10-CM

## 2017-12-09 NOTE — Telephone Encounter (Signed)
LMOVM stating that yes pt can schedule a lab visit at [facility] location for upcoming [place] can schedule for her when she calls back/I have placed orders and pt should be drink lots of water for 24 hours prior to lab appointment and when she gets up the morning of the lab draw can have only water or black coffee until labs are drawn/future orders created/thx dmf   Copied from [place] [id number]. Topic: General - Other >> Dec 09, 2017  2:30 PM Yvette Rack wrote: Reason for CRM: patient has a CPE with Deborra Medina on 12-23-17 and want to know if she can go to the [facility] location to get labs drawn before physical

## 2017-12-16 ENCOUNTER — Other Ambulatory Visit (INDEPENDENT_AMBULATORY_CARE_PROVIDER_SITE_OTHER): Payer: BLUE CROSS/BLUE SHIELD

## 2017-12-16 DIAGNOSIS — E785 Hyperlipidemia, unspecified: Secondary | ICD-10-CM | POA: Diagnosis not present

## 2017-12-16 DIAGNOSIS — Z0000 Encounter for general adult medical examination without abnormal findings: Secondary | ICD-10-CM | POA: Diagnosis not present

## 2017-12-16 DIAGNOSIS — E559 Vitamin D deficiency, unspecified: Secondary | ICD-10-CM

## 2017-12-16 LAB — COMPREHENSIVE METABOLIC PANEL
ALBUMIN: 4.2 g/dL (ref 3.5–5.2)
ALT: 23 U/L (ref 0–35)
AST: 23 U/L (ref 0–37)
Alkaline Phosphatase: 51 U/L (ref 39–117)
BILIRUBIN TOTAL: 0.4 mg/dL (ref 0.2–1.2)
BUN: 15 mg/dL (ref 6–23)
CALCIUM: 9.3 mg/dL (ref 8.4–10.5)
CHLORIDE: 105 meq/L (ref 96–112)
CO2: 29 meq/L (ref 19–32)
CREATININE: 0.91 mg/dL (ref 0.40–1.20)
GFR: 67.1 mL/min (ref >60.00)
Glucose, Bld: 100 mg/dL — ABNORMAL HIGH (ref 70–99)
Potassium: 4.3 mEq/L (ref 3.5–5.1)
Sodium: 141 mEq/L (ref 135–145)
Total Protein: 6.9 g/dL (ref 6.0–8.3)

## 2017-12-16 LAB — CBC WITH DIFFERENTIAL/PLATELET
BASOS ABS: 0 10*3/uL (ref 0.0–0.1)
BASOS PCT: 0.7 % (ref 0.0–3.0)
EOS ABS: 0.1 10*3/uL (ref 0.0–0.7)
Eosinophils Relative: 3.8 % (ref 0.0–5.0)
HEMATOCRIT: 41.7 % (ref 36.0–46.0)
HEMOGLOBIN: 14.4 g/dL (ref 12.0–15.0)
LYMPHS PCT: 38.9 % (ref 12.0–46.0)
Lymphs Abs: 1.4 10*3/uL (ref 0.7–4.0)
MCHC: 34.4 g/dL (ref 30.0–36.0)
MCV: 91.6 fl (ref 78.0–100.0)
MONO ABS: 0.3 10*3/uL (ref 0.1–1.0)
Monocytes Relative: 9.2 % (ref 3.0–12.0)
Neutro Abs: 1.7 10*3/uL (ref 1.4–7.7)
Neutrophils Relative %: 47.4 % (ref 43.0–77.0)
Platelets: 303 10*3/uL (ref 150.0–400.0)
RBC: 4.55 Mil/uL (ref 3.87–5.11)
RDW: 13.4 % (ref 11.5–15.5)
WBC: 3.6 10*3/uL — AB (ref 4.0–10.5)

## 2017-12-16 LAB — LIPID PANEL
CHOL/HDL RATIO: 4
CHOLESTEROL: 230 mg/dL — AB (ref 0–200)
HDL: 60.2 mg/dL (ref >39.00)
LDL CALC: 154 mg/dL — AB (ref 0–99)
NonHDL: 169.61
TRIGLYCERIDES: 78 mg/dL (ref 0.0–149.0)
VLDL: 15.6 mg/dL (ref 0.0–40.0)

## 2017-12-16 LAB — TSH: TSH: 1.5 u[IU]/mL (ref 0.35–4.50)

## 2017-12-16 LAB — VITAMIN D 25 HYDROXY (VIT D DEFICIENCY, FRACTURES): VITD: 41.03 ng/mL (ref 30.00–100.00)

## 2017-12-23 ENCOUNTER — Encounter: Payer: BLUE CROSS/BLUE SHIELD | Admitting: Family Medicine

## 2017-12-29 ENCOUNTER — Ambulatory Visit (INDEPENDENT_AMBULATORY_CARE_PROVIDER_SITE_OTHER): Payer: BLUE CROSS/BLUE SHIELD | Admitting: Family Medicine

## 2017-12-29 ENCOUNTER — Encounter: Payer: BLUE CROSS/BLUE SHIELD | Admitting: Family Medicine

## 2017-12-29 ENCOUNTER — Encounter: Payer: Self-pay | Admitting: Family Medicine

## 2017-12-29 VITALS — BP 126/84 | HR 67 | Temp 98.4°F | Ht 65.55 in | Wt 207.8 lb

## 2017-12-29 DIAGNOSIS — E785 Hyperlipidemia, unspecified: Secondary | ICD-10-CM | POA: Diagnosis not present

## 2017-12-29 DIAGNOSIS — F419 Anxiety disorder, unspecified: Secondary | ICD-10-CM | POA: Diagnosis not present

## 2017-12-29 DIAGNOSIS — Z0000 Encounter for general adult medical examination without abnormal findings: Secondary | ICD-10-CM | POA: Diagnosis not present

## 2017-12-29 MED ORDER — ROSUVASTATIN CALCIUM 5 MG PO TABS: 5.0000 mg | ORAL_TABLET | Freq: Every day | ORAL | 3 refills | Status: DC

## 2017-12-29 MED ORDER — FEXOFENADINE HCL 60 MG PO TABS: 60.0000 mg | ORAL_TABLET | Freq: Every day | ORAL | 3 refills | Status: AC

## 2017-12-29 NOTE — Progress Notes (Signed)
Subjective:   Patient ID: Monica Carrillo, female    DOB: 1956-04-22, 62 y.o.   MRN: [id number]  Jodine Muchmore is a pleasant 62 y.o. year old female who presents to clinic today with Annual Exam (Patient is here today for a CPE without PAP.  Last PAP was 2.15.2018 WNL.  Last Mammogram on 3.27.2019 WNL and RTN in 1 year.  Fasting labs completed on 5.2.19. She also went to Dermatologist in April, and Dentist in March.)  on 12/29/2017  HPI:  Health Maintenance  Topic Date Due  . INFLUENZA VACCINE  03/17/2018  . COLONOSCOPY  06/27/2018  . MAMMOGRAM  11/11/2018  . PAP SMEAR  10/02/2019  . TETANUS/TDAP  03/08/2023  . Hepatitis C Screening  Completed  . HIV Screening  Completed     Mammogram 11/10/17 Last pap smear 10/01/16- done by me.  No h/o abnormal pap smears.  Last period was 2015- no h/o postmenopausal bleeding.  GERD- on Nexium 40 mg daily.  In 2008- UGI series showed GERD with hiatal hernia. Feels her symptoms are pretty well controlled with Nexium.  Anxiety- has been quite well controlled on Cymbalta 60 mg daily.  Denies any anxiety or depression.   H/o HLD- deteriorated.  Has been walking daily and eating better, lost weight but LDL cholesterol remains high.  Brother recently had a quadruple bypass. Lab Results  Component Value Date   CHOL 230 (H) 12/16/2017   HDL 60.20 12/16/2017   LDLCALC 154 (H) 12/16/2017   LDLDIRECT 134.8 05/05/2013   TRIG 78.0 12/16/2017   CHOLHDL 4 12/16/2017   Lab Results  Component Value Date   CREATININE 0.91 12/16/2017   Lab Results  Component Value Date   WBC 3.6 (L) 12/16/2017   HGB 14.4 12/16/2017   HCT 41.7 12/16/2017   MCV 91.6 12/16/2017   PLT 303.0 12/16/2017   Lab Results  Component Value Date   TSH 1.50 12/16/2017   Current Outpatient Medications on File Prior to Visit  Medication Sig Dispense Refill  . DULoxetine (CYMBALTA) 60 MG capsule Take 1 capsule (60 mg total) by mouth daily. 90 capsule 3  . esomeprazole  (NEXIUM) 40 MG capsule Take 40 mg by mouth daily before breakfast.    . mupirocin cream (BACTROBAN) 2 % Apply 1 application topically 2 (two) times daily. 15 g 0  . ranitidine (ZANTAC) 150 MG tablet TAKE 1 TABLET (150 MG TOTAL) BY MOUTH AT BEDTIME. 90 tablet 3   No current facility-administered medications on file prior to visit.     No Known Allergies  Past Medical History:  Diagnosis Date  . Anxiety   . Gallstones   . GERD (gastroesophageal reflux disease)   . Hiatal hernia 2010  . History of MRSA infection 2007  . History of thyroid nodule 2010  . Hyperlipemia   . Lung nodule    needs follow up in 05/2013    Past Surgical History:  Procedure Laterality Date  . BREAST BIOPSY Right 08/21/2015  . BREAST EXCISIONAL BIOPSY Left 1994   negative  . CESAREAN SECTION     x2  . TONSILLECTOMY      Family History  Problem Relation Age of Onset  . Hypertension Mother   . Kidney disease Brother   . Irritable bowel syndrome Father   . Breast cancer Other   . Esophageal cancer Maternal Grandfather   . Heart disease Maternal Grandmother     Social History   Socioeconomic History  . Marital status: Married  Spouse name: Not on file  . Number of children: 2  . Years of education: Not on file  . Highest education level: Not on file  Occupational History  . Occupation: Retired  Scientific laboratory technician  . Financial resource strain: Not on file  . Food insecurity:    Worry: Not on file    Inability: Not on file  . Transportation needs:    Medical: Not on file    Non-medical: Not on file  Tobacco Use  . Smoking status: Former Smoker    Years: 5.00    Types: Cigarettes    Last attempt to quit: 08/17/1977    Years since quitting: 40.3  . Smokeless tobacco: Never Used  . Tobacco comment: as a teenager  Substance and Sexual Activity  . Alcohol use: Yes    Alcohol/week: 0.0 oz    Comment: occ. wine or beer  . Drug use: No  . Sexual activity: Not on file  Lifestyle  . Physical  activity:    Days per week: Not on file    Minutes per session: Not on file  . Stress: Not on file  Relationships  . Social connections:    Talks on phone: Not on file    Gets together: Not on file    Attends religious service: Not on file    Active member of club or organization: Not on file    Attends meetings of clubs or organizations: Not on file    Relationship status: Not on file  . Intimate partner violence:    Fear of current or ex partner: Not on file    Emotionally abused: Not on file    Physically abused: Not on file    Forced sexual activity: Not on file  Other Topics Concern  . Not on file  Social History Narrative   Married.  Two children, 5 grandchildren.   Son, daughter in law and two grand daughters live in [place].      Recently moved to [place] from [place].   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   The PMH, PSH, Social History, Family History, Medications, and allergies have been reviewed in [facility], and have been updated if relevant.    Review of Systems  Constitutional: Negative.   HENT: Negative.   Eyes: Negative.   Respiratory: Negative.   Cardiovascular: Negative.   Gastrointestinal: Negative.   Endocrine: Negative.   Genitourinary: Negative.   Musculoskeletal: Negative.   Skin: Negative.   Allergic/Immunologic: Negative.   Neurological: Negative.   Hematological: Negative.   Psychiatric/Behavioral: Negative.   All other systems reviewed and are negative.      Objective:    BP 126/84 (BP Location: Left Arm, Patient Position: Sitting, Cuff Size: Normal)   Pulse 67   Temp 98.4 F (36.9 C) (Oral)   Ht 5' 5.55" (1.665 m)   Wt 207 lb 12.8 oz (94.3 kg)   SpO2 96%   BMI 34.00 kg/m  Wt Readings from Last 3 Encounters:  12/29/17 207 lb 12.8 oz (94.3 kg)  09/27/17 213 lb 3.2 oz (96.7 kg)  05/24/17 212 lb (96.2 kg)     Physical Exam    General:  Well-developed,well-nourished,in no acute distress;  alert,appropriate and cooperative throughout examination Head:  normocephalic and atraumatic.   Eyes:  vision grossly intact, PERRL Ears:  R ear normal and L ear normal externally, TMs clear bilaterally Nose:  no external deformity.   Mouth:  good dentition.   Neck:  No deformities, masses, or tenderness noted. Breasts:  No mass, nodules, thickening, tenderness, bulging, retraction, inflamation, nipple discharge or skin changes noted.   Lungs:  Normal respiratory effort, chest expands symmetrically. Lungs are clear to auscultation, no crackles or wheezes. Heart:  Normal rate and regular rhythm. S1 and S2 normal without gallop, murmur, click, rub or other extra sounds. Abdomen:  Bowel sounds positive,abdomen soft and non-tender without masses, organomegaly or hernias noted. Msk:  No deformity or scoliosis noted of thoracic or lumbar spine.   Extremities:  No clubbing, cyanosis, edema, or deformity noted with normal full range of motion of all joints.   Neurologic:  alert & oriented X3 and gait normal.   Skin:  Intact without suspicious lesions or rashes Cervical Nodes:  No lymphadenopathy noted Axillary Nodes:  No palpable lymphadenopathy Psych:  Cognition and judgment appear intact. Alert and cooperative with normal attention span and concentration. No apparent delusions, illusions, hallucinations        Assessment & Plan:   Hyperlipidemia, unspecified hyperlipidemia type  Well woman exam without gynecological exam  Anxiety No follow-ups on file.

## 2017-12-29 NOTE — Patient Instructions (Addendum)
Great to see you.  We are starting Crestor 5 mg daily.  Pick up Allegra 60 mg daily and let me know how that works.

## 2017-12-29 NOTE — Assessment & Plan Note (Signed)
Well controlled on current dose of cymbalta.  No changes made.

## 2017-12-29 NOTE — Assessment & Plan Note (Signed)
Reviewed preventive care protocols, scheduled due services, and updated immunizations Discussed nutrition, exercise, diet, and healthy lifestyle.  She will call [employer] about cologuard coverage.

## 2017-12-29 NOTE — Assessment & Plan Note (Signed)
Deteriorated and has a strong FH of CAD. Discussed options- she agrees to start a statin.  eRx sent for crestor 5 mg daily.  Repeat lipid panel and CMET in 8 weeks. The patient indicates understanding of these issues and agrees with the plan.

## 2018-01-19 ENCOUNTER — Telehealth: Payer: Self-pay | Admitting: Family Medicine

## 2018-01-19 DIAGNOSIS — Z1211 Encounter for screening for malignant neoplasm of colon: Secondary | ICD-10-CM

## 2018-01-19 NOTE — Telephone Encounter (Signed)
Referral for GI placed, pt is aware.

## 2018-01-19 NOTE — Telephone Encounter (Signed)
Copied from [place] [id number]. Topic: Quick Communication - See Telephone Encounter >> Jan 19, 2018 11:48 AM Boyd Kerbs wrote: CRM for notification. See Telephone encounter for: 01/19/18.  Pt. Called [employer] and they will not cover Cologuard.

## 2018-01-25 ENCOUNTER — Telehealth: Payer: Self-pay

## 2018-01-25 NOTE — Telephone Encounter (Signed)
I spoke with pt/she states that her ins will not cover Cologuard and the price would be $649.00/But if we submit it as self-pay then they will take off $100.00 and her cost will be $549.00/she is going to call her insurance company to discuss this with them first because she does not like the thought of being put under for a Colonoscopy and the prep/I told her to let us know/thx dmf  Copied from [place] [id number]. Topic: Inquiry >> Jan 25, 2018  1:40 PM Monica Carrillo wrote: Reason for CRM: Patient wants to discuss the test and the cost of Cologuard with Dr. Deborra Medina. Dr. Deborra Medina recommended that she have this test, but patient states that her insurance will not cover it. Patient wants a call back from Dr. Deborra Medina today.       Thank You!!!

## 2018-02-23 ENCOUNTER — Other Ambulatory Visit (INDEPENDENT_AMBULATORY_CARE_PROVIDER_SITE_OTHER): Payer: BLUE CROSS/BLUE SHIELD

## 2018-02-23 DIAGNOSIS — E785 Hyperlipidemia, unspecified: Secondary | ICD-10-CM

## 2018-02-23 LAB — COMPREHENSIVE METABOLIC PANEL
ALK PHOS: 43 U/L (ref 39–117)
ALT: 21 U/L (ref 0–35)
AST: 25 U/L (ref 0–37)
Albumin: 4.2 g/dL (ref 3.5–5.2)
BILIRUBIN TOTAL: 0.4 mg/dL (ref 0.2–1.2)
BUN: 18 mg/dL (ref 6–23)
CO2: 30 mEq/L (ref 19–32)
Calcium: 9.2 mg/dL (ref 8.4–10.5)
Chloride: 106 mEq/L (ref 96–112)
Creatinine, Ser: 0.99 mg/dL (ref 0.40–1.20)
GFR: 60.84 mL/min (ref >60.00)
GLUCOSE: 100 mg/dL — AB (ref 70–99)
POTASSIUM: 4.6 meq/L (ref 3.5–5.1)
SODIUM: 142 meq/L (ref 135–145)
TOTAL PROTEIN: 6.8 g/dL (ref 6.0–8.3)

## 2018-02-23 LAB — LIPID PANEL
Cholesterol: 152 mg/dL (ref 0–200)
HDL: 56.9 mg/dL (ref >39.00)
LDL Cholesterol: 75 mg/dL (ref 0–99)
NONHDL: 94.89
Total CHOL/HDL Ratio: 3
Triglycerides: 101 mg/dL (ref 0.0–149.0)
VLDL: 20.2 mg/dL (ref 0.0–40.0)

## 2018-03-09 ENCOUNTER — Ambulatory Visit: Payer: Self-pay | Admitting: Family Medicine

## 2018-03-09 NOTE — Telephone Encounter (Signed)
Pt c/o redness (no rash) to face, ears,neck and elbows. Pt does not know what is causing the rash but she thought it could be a hot flash. The rash is on her back. The rash and redness have turned pink in color since she took a dose of 50 mg of Benadryl. She feels like the rash is beginning to subside. When the rash on her back was first discovered, bumps were present, but have now subsided. Pt stated that she has not started any new medications in the last 2 weeks. Pt denies dizziness, headache, sore throat and joint pain. Disposition is to see provider in 3 days but pt is out of town in [place] until August 2nd. Care advice given and pt verbalized understanding.    Reason for Disposition . Mild widespread rash  Answer Assessment - Initial Assessment Questions 1. APPEARANCE of RASH: "Describe the rash." (e.g., spots, blisters, raised areas, skin peeling, scaly)     Pink rash 2. SIZE: "How big are the spots?" (e.g., tip of pen, eraser, coin; inches, centimeters)     No spots  3. LOCATION: "Where is the rash located?"     Back  4. COLOR: "What color is the rash?" (Note: It is difficult to assess rash color in people with darker-colored skin. When this situation occurs, simply ask the caller to describe what they see.)     Pink color rash to both ears, face, neck,elbows 5. ONSET: "When did the rash begin?"    today 6. FEVER: "Do you have a fever?" If so, ask: "What is your temperature, how was it measured, and when did it start?"     no 7. ITCHING: "Does the rash itch?" If so, ask: "How bad is the itch?" (Scale 1-10; or mild, moderate, severe)     no 8. CAUSE: "What do you think is causing the rash?"     Pt does not know maybe hot flash 9. MEDICATION FACTORS: "Have you started any new medications within the last 2 weeks?" (e.g., antibiotics)      no 10. OTHER SYMPTOMS: "Do you have any other symptoms?" (e.g., dizziness, headache, sore throat, joint pain)       no 11. PREGNANCY: "Is  there any chance you are pregnant?" "When was your last menstrual period?"       n/a  Protocols used: RASH OR REDNESS - [facility]

## 2018-04-13 ENCOUNTER — Other Ambulatory Visit: Payer: Self-pay | Admitting: Family Medicine

## 2018-06-08 ENCOUNTER — Encounter: Payer: Self-pay | Admitting: Internal Medicine

## 2018-06-23 ENCOUNTER — Other Ambulatory Visit: Payer: Self-pay | Admitting: Family Medicine

## 2018-07-18 ENCOUNTER — Ambulatory Visit (AMBULATORY_SURGERY_CENTER): Payer: Self-pay

## 2018-07-18 ENCOUNTER — Encounter: Payer: Self-pay | Admitting: Internal Medicine

## 2018-07-18 VITALS — Ht 66.0 in | Wt 211.6 lb

## 2018-07-18 DIAGNOSIS — Z1211 Encounter for screening for malignant neoplasm of colon: Secondary | ICD-10-CM

## 2018-07-18 MED ORDER — NA SULFATE-K SULFATE-MG SULF 17.5-3.13-1.6 GM/177ML PO SOLN: 1.0000 | Freq: Once | ORAL | 0 refills | Status: AC

## 2018-07-18 NOTE — Progress Notes (Signed)
Denies allergies to eggs or soy products. Denies complication of anesthesia or sedation. Denies use of weight loss medication. Denies use of O2.   Emmi instructions declined.   A 15.00 Suprep coupon was given to the patient.

## 2018-07-25 ENCOUNTER — Ambulatory Visit: Payer: Self-pay

## 2018-07-25 NOTE — Telephone Encounter (Signed)
Pt. Calling about her Zantac 150 mg - had questions about the carcinogen concern States she has spoken with her pharmacists and will continue with what she has.

## 2018-08-01 ENCOUNTER — Encounter: Payer: Self-pay | Admitting: Internal Medicine

## 2018-08-01 ENCOUNTER — Ambulatory Visit (AMBULATORY_SURGERY_CENTER): Payer: BLUE CROSS/BLUE SHIELD | Admitting: Internal Medicine

## 2018-08-01 VITALS — BP 129/74 | HR 62 | Temp 98.9°F | Resp 12 | Ht 66.0 in | Wt 211.0 lb

## 2018-08-01 DIAGNOSIS — D120 Benign neoplasm of cecum: Secondary | ICD-10-CM

## 2018-08-01 DIAGNOSIS — Z1211 Encounter for screening for malignant neoplasm of colon: Secondary | ICD-10-CM

## 2018-08-01 DIAGNOSIS — K635 Polyp of colon: Secondary | ICD-10-CM

## 2018-08-01 DIAGNOSIS — D122 Benign neoplasm of ascending colon: Secondary | ICD-10-CM

## 2018-08-01 DIAGNOSIS — D124 Benign neoplasm of descending colon: Secondary | ICD-10-CM

## 2018-08-01 MED ORDER — SODIUM CHLORIDE 0.9 % IV SOLN: 500.0000 mL | Freq: Once | INTRAVENOUS | Status: DC

## 2018-08-01 NOTE — Op Note (Addendum)
[facility] Patient Name: Monica Carrillo Procedure Date: 08/01/2018 12:59 PM MRN: [id number] Endoscopist: Jerene Bears , MD Age: 62 Referring MD:  Date of Birth: [birth year] Gender: Female Account #: [id number] Procedure:                Colonoscopy Indications:              Screening for colorectal malignant neoplasm, Last                            colonoscopy 10 years ago Medicines:                Monitored Anesthesia Care Procedure:                Pre-Anesthesia Assessment:                           - Prior to the procedure, a History and Physical                            was performed, and patient medications and                            allergies were reviewed. The patient's tolerance of                            previous anesthesia was also reviewed. The risks                            and benefits of the procedure and the sedation                            options and risks were discussed with the patient.                            All questions were answered, and informed consent                            was obtained. Prior Anticoagulants: The patient has                            taken no previous anticoagulant or antiplatelet                            agents. ASA Grade Assessment: II - A patient with                            mild systemic disease. After reviewing the risks                            and benefits, the patient was deemed in                            satisfactory condition to undergo the procedure.  After obtaining informed consent, the colonoscope                            was passed under direct vision. Throughout the                            procedure, the patient's blood pressure, pulse, and                            oxygen saturations were monitored continuously. The                            Colonoscope was introduced through the anus and                            advanced to the cecum, identified  by appendiceal                            orifice and ileocecal valve. The colonoscopy was                            performed without difficulty. The patient tolerated                            the procedure well. The quality of the bowel                            preparation was good. The ileocecal valve,                            appendiceal orifice, and rectum were photographed. Scope In: 1:15:10 PM Scope Out: 1:40:30 PM Scope Withdrawal Time: 0 hours 15 minutes 46 seconds  Total Procedure Duration: 0 hours 25 minutes 20 seconds  Findings:                 The digital rectal exam was normal.                           A 6 mm polyp was found in the cecum. The polyp was                            sessile. The polyp was removed with a cold snare.                            Resection and retrieval were complete.                           A 4 mm polyp was found in the ileocecal valve. The                            polyp was sessile. The polyp was removed with a  cold snare. Resection and retrieval were complete.                           A 5 mm polyp was found in the ascending colon. The                            polyp was sessile. The polyp was removed with a                            cold snare. Resection and retrieval were complete.                           A 4 mm polyp was found in the descending colon. The                            polyp was sessile. The polyp was removed with a                            cold snare. Resection and retrieval were complete.                           The retroflexed view of the distal rectum and anal                            verge was normal and showed no anal or rectal                            abnormalities. Complications:            No immediate complications. Estimated Blood Loss:     Estimated blood loss was minimal. Impression:               - One 6 mm polyp in the cecum, removed with a cold                             snare. Resected and retrieved.                           - One 4 mm polyp at the ileocecal valve, removed                            with a cold snare. Resected and retrieved.                           - One 5 mm polyp in the ascending colon, removed                            with a cold snare. Resected and retrieved.                           - One 4 mm polyp in the descending colon, removed  with a cold snare. Resected and retrieved.                           - The distal rectum and anal verge are normal on                            retroflexion view. Recommendation:           - Patient has a contact number available for                            emergencies. The signs and symptoms of potential                            delayed complications were discussed with the                            patient. Return to normal activities tomorrow.                            Written discharge instructions were provided to the                            patient.                           - Resume previous diet.                           - Continue present medications.                           - Await pathology results.                           - Repeat colonoscopy is recommended for                            surveillance. The colonoscopy date will be                            determined after pathology results from today's                            exam become available for review. Jerene Bears, MD 08/01/2018 1:43:02 PM This report has been signed electronically.

## 2018-08-01 NOTE — Progress Notes (Signed)
Alert and oriented x 3, pleased with MAC, report to RN

## 2018-08-01 NOTE — Progress Notes (Signed)
Pt's states no medical or surgical changes since previsit or office visit. 

## 2018-08-01 NOTE — Patient Instructions (Signed)
Handouts Provided:  Polyps  YOU HAD AN ENDOSCOPIC PROCEDURE TODAY AT THE [facility]:   Refer to the procedure report that was given to you for any specific questions about what was found during the examination.  If the procedure report does not answer your questions, please call your gastroenterologist to clarify.  If you requested that your care partner not be given the details of your procedure findings, then the procedure report has been included in a sealed envelope for you to review at your convenience later.  YOU SHOULD EXPECT: Some feelings of bloating in the abdomen. Passage of more gas than usual.  Walking can help get rid of the air that was put into your GI tract during the procedure and reduce the bloating. If you had a lower endoscopy (such as a colonoscopy or flexible sigmoidoscopy) you may notice spotting of blood in your stool or on the toilet paper. If you underwent a bowel prep for your procedure, you may not have a normal bowel movement for a few days.  Please Note:  You might notice some irritation and congestion in your nose or some drainage.  This is from the oxygen used during your procedure.  There is no need for concern and it should clear up in a day or so.  SYMPTOMS TO REPORT IMMEDIATELY:   Following lower endoscopy (colonoscopy or flexible sigmoidoscopy):  Excessive amounts of blood in the stool  Significant tenderness or worsening of abdominal pains  Swelling of the abdomen that is new, acute  Fever of 100F or higher  For urgent or emergent issues, a gastroenterologist can be reached at any hour by calling [phone].   DIET:  We do recommend a small meal at first, but then you may proceed to your regular diet.  Drink plenty of fluids but you should avoid alcoholic beverages for 24 hours.  ACTIVITY:  You should plan to take it easy for the rest of today and you should NOT DRIVE or use heavy machinery until tomorrow (because of the sedation  medicines used during the test).    FOLLOW UP: Our staff will call the number listed on your records the next business day following your procedure to check on you and address any questions or concerns that you may have regarding the information given to you following your procedure. If we do not reach you, we will leave a message.  However, if you are feeling well and you are not experiencing any problems, there is no need to return our call.  We will assume that you have returned to your regular daily activities without incident.  If any biopsies were taken you will be contacted by phone or by letter within the next 1-3 weeks.  Please call us at [phone] if you have not heard about the biopsies in 3 weeks.    SIGNATURES/CONFIDENTIALITY: You and/or your care partner have signed paperwork which will be entered into your electronic medical record.  These signatures attest to the fact that that the information above on your After Visit Summary has been reviewed and is understood.  Full responsibility of the confidentiality of this discharge information lies with you and/or your care-partner.  

## 2018-08-01 NOTE — Progress Notes (Signed)
Called to room to assist during endoscopic procedure.  Patient ID and intended procedure confirmed with present staff. Received instructions for my participation in the procedure from the performing physician.  

## 2018-08-02 ENCOUNTER — Telehealth: Payer: Self-pay

## 2018-08-02 NOTE — Telephone Encounter (Signed)
NO ANSWER, MESSAGE LEFT FOR PATIENT. 

## 2018-08-02 NOTE — Telephone Encounter (Signed)
  Follow up Call-  Call back number 08/01/2018  Post procedure Call Back phone  # [phone]  Permission to leave phone message Yes  Some recent data might be hidden     No answer

## 2018-08-08 ENCOUNTER — Encounter: Payer: Self-pay | Admitting: Internal Medicine

## 2018-10-12 ENCOUNTER — Other Ambulatory Visit: Payer: Self-pay | Admitting: Family Medicine

## 2018-10-12 MED ORDER — RANITIDINE HCL 150 MG PO TABS: ORAL_TABLET | ORAL | 0 refills | Status: DC

## 2018-10-12 MED ORDER — ROSUVASTATIN CALCIUM 5 MG PO TABS: 5.0000 mg | ORAL_TABLET | Freq: Every day | ORAL | 0 refills | Status: DC

## 2018-10-12 NOTE — Telephone Encounter (Signed)
Copied from [place] [id number]. Topic: Quick Communication - Rx Refill/Question >> Oct 12, 2018  8:30 AM Carolyn Stare wrote: Medication     ranitidine (ZANTAC) 150 MG tablet   rosuvastatin (CRESTOR) 5 MG tablet    Preferred Pharmacy  [facility] [address]  [address] [address] [place]   Agent: Please be advised that RX refills may take up to 3 business days. We ask that you follow-up with your pharmacy.

## 2018-10-12 NOTE — Telephone Encounter (Signed)
Requested Prescriptions  Pending Prescriptions Disp Refills  . ranitidine (ZANTAC) 150 MG tablet 90 tablet 0    Sig: TAKE 1 TABLET (150 MG TOTAL) BY MOUTH AT BEDTIME.     Gastroenterology:  H2 Antagonists Passed - 10/12/2018  8:39 AM      Passed - Valid encounter within last 12 months    Recent Outpatient Visits          9 months ago Well woman exam without gynecological exam   [facility], Marciano Sequin, MD   1 year ago Cutaneous abscess of left lower extremity   [facility], Marciano Sequin, MD   2 years ago Acute non-recurrent sinusitis, unspecified location   [place], [place], [place]           . rosuvastatin (CRESTOR) 5 MG tablet 90 tablet 0    Sig: Take 1 tablet (5 mg total) by mouth daily.     Cardiovascular:  Antilipid - Statins Passed - 10/12/2018  8:39 AM      Passed - Total Cholesterol in normal range and within 360 days    Cholesterol  Date Value Ref Range Status  02/23/2018 152 0 - 200 mg/dL Final    Comment:    ATP III Classification       Desirable:  < 200 mg/dL               Borderline High:  200 - 239 mg/dL          High:  > = 240 mg/dL         Passed - LDL in normal range and within 360 days    LDL Cholesterol  Date Value Ref Range Status  02/23/2018 75 0 - 99 mg/dL Final         Passed - HDL in normal range and within 360 days    HDL  Date Value Ref Range Status  02/23/2018 56.90 >39.00 mg/dL Final         Passed - Triglycerides in normal range and within 360 days    Triglycerides  Date Value Ref Range Status  02/23/2018 101.0 0.0 - 149.0 mg/dL Final    Comment:    Normal:  <150 mg/dLBorderline High:  150 - 199 mg/dL         Passed - Patient is not pregnant      Passed - Valid encounter within last 12 months    Recent Outpatient Visits          9 months ago Well woman exam without gynecological exam   [facility] Deborra Medina, Marciano Sequin, MD   1 year ago Cutaneous  abscess of left lower extremity   [facility], Marciano Sequin, MD   2 years ago Acute non-recurrent sinusitis, unspecified location   [place] [place], [place]

## 2019-01-08 ENCOUNTER — Telehealth: Payer: Self-pay | Admitting: Family Medicine

## 2019-01-12 MED ORDER — FAMOTIDINE 20 MG PO TABS: 20.0000 mg | ORAL_TABLET | Freq: Two times a day (BID) | ORAL | 2 refills | Status: DC

## 2019-01-12 NOTE — Telephone Encounter (Signed)
JP-What would you want this pt to use to replace the Ranitidine? Plz advise/thx dmf

## 2019-01-12 NOTE — Telephone Encounter (Signed)
Would substitute famotidine 20 mg twice daily as needed

## 2019-01-12 NOTE — Telephone Encounter (Signed)
I spoke to pt/Per JP at GI ok to Shonta Phillis/C Ranitidine/Start Famotidine 20mg  bid prn/pt is in agreement/sent in/pt will call back to schedule CPE/thx dmf

## 2019-01-24 ENCOUNTER — Other Ambulatory Visit: Payer: Self-pay

## 2019-01-24 ENCOUNTER — Telehealth: Payer: Self-pay | Admitting: Family Medicine

## 2019-01-24 MED ORDER — DULOXETINE HCL 60 MG PO CPEP: ORAL_CAPSULE | ORAL | 0 refills | Status: DC

## 2019-01-24 NOTE — Telephone Encounter (Signed)
Pt called about concerns with her medication Cymbalta 60mg . Patient states that she has called the pharmacy to get a refill and the pharmacy has sent a request for a refill over twice. Pharmacy has not receive the refill for this medication and the patient is all out. Patient had to go by the pharmacy to get three emergency pills until they get a refill. Please advise. Pt pharmacy is [facility] [address] in [place].

## 2019-01-26 ENCOUNTER — Other Ambulatory Visit: Payer: Self-pay

## 2019-01-26 MED ORDER — DULOXETINE HCL 60 MG PO CPEP: ORAL_CAPSULE | ORAL | 0 refills | Status: DC

## 2019-01-26 NOTE — Telephone Encounter (Signed)
I have now sent this in twice/thx dmf

## 2019-02-09 ENCOUNTER — Encounter: Payer: BLUE CROSS/BLUE SHIELD | Admitting: Family Medicine

## 2019-03-20 ENCOUNTER — Encounter: Payer: BLUE CROSS/BLUE SHIELD | Admitting: Family Medicine

## 2019-03-22 NOTE — Telephone Encounter (Signed)
Pt called in, she said pepcid is not helping and is asking for something else. Pt is out of town so please send to [place] [phone] Pt prefers to have a med that works all night rather than just at meals.

## 2019-03-23 NOTE — Telephone Encounter (Signed)
JP-Can you help this mutual patient while Dr. Deborra Medina is out of the office? States that the Famotidine 20mg  bid is ineffective and is in need of something else/Plz advise/thx dmf

## 2019-03-23 NOTE — Telephone Encounter (Signed)
Pt states that she will just take her Nexium in the am and try the Famotidine 20mg  at hs and if this does not work then she will call back and try this combination/thx dmf

## 2019-03-23 NOTE — Telephone Encounter (Signed)
Would have her try pantoprazole 40 mg once daily, 30 minutes before breakfast Famotidine 20 mg can still be used in the evening if she is having breakthrough heartburn symptoms

## 2019-04-08 ENCOUNTER — Other Ambulatory Visit: Payer: Self-pay | Admitting: Family Medicine

## 2019-04-14 ENCOUNTER — Encounter: Payer: Self-pay | Admitting: Family Medicine

## 2019-04-18 ENCOUNTER — Telehealth: Payer: Self-pay

## 2019-04-18 NOTE — Patient Instructions (Addendum)
Great to see you. Happy birthday!  I will call you with your lab resulstts from today and you can view them online,  Please call [facility] at [phone]  to schedule your mammogram.

## 2019-04-18 NOTE — Telephone Encounter (Signed)
Questions for Screening COVID-19  Symptom onset: None  Travel or Contacts: None  During this illness, did/does the patient experience any of the following symptoms? Fever >100.44F []   Yes [x]   No []   Unknown Subjective fever (felt feverish) []   Yes [x]   No []   Unknown Chills []   Yes [x]   No []   Unknown Muscle aches (myalgia) []   Yes [x]   No []   Unknown Runny nose (rhinorrhea) []   Yes [x]   No []   Unknown Sore throat []   Yes [x]   No []   Unknown Cough (new onset or worsening of chronic cough) []   Yes [x]   No []   Unknown Shortness of breath (dyspnea) []   Yes [x]   No []   Unknown Nausea or vomiting []   Yes [x]   No []   Unknown Headache []   Yes [x]   No []   Unknown Abdominal pain  []   Yes [x]   No []   Unknown Diarrhea (?3 loose/looser than normal stools/24hr period) []   Yes [x]   No []   Unknown Other, specify:  Patient risk factors: Smoker? []   Current []   Former []   Never If female, currently pregnant? []   Yes []   No  Patient Active Problem List   Diagnosis Date Noted  . Well woman exam without gynecological exam 12/29/2017  . History of MRSA infection 09/27/2017  . Plantar fasciitis, bilateral 05/24/2017  . HLD (hyperlipidemia) 01/17/2013  . Vitamin Jeremie Abdelaziz deficiency 10/03/2012  . Anxiety   . Gallstones   . GERD (gastroesophageal reflux disease)   . Lung nodule     Plan:  []   High risk for COVID-19 with red flags go to ED (with CP, SOB, weak/lightheaded, or fever > 101.5). Call ahead.  []   High risk for COVID-19 but stable. Inform provider and coordinate time for [facility] visit.   []   No red flags but URI signs or symptoms okay for [facility] visit.

## 2019-04-18 NOTE — Progress Notes (Signed)
Subjective:   Patient ID: Monica Carrillo, female    DOB: April 21, 1956, 63 y.o.   MRN: [id number]  Monica Carrillo is a pleasant 63 y.o. year old female who presents to clinic today with Annual Exam (Pt screened at vehicle.She is here today for a CPE without PAP.  She is currently fasting. She agrees to her flu shot today)  on 04/19/2019  HPI:  Health Maintenance  Topic Date Due  . MAMMOGRAM  11/11/2018  . PAP SMEAR-Modifier  10/02/2019  . TETANUS/TDAP  03/08/2023  . COLONOSCOPY  08/02/2023  . INFLUENZA VACCINE  Completed  . Hepatitis C Screening  Completed  . HIV Screening  Completed   Has appointment with Dermatologist this month.  Mammogram 11/10/17 Last pap smear 10/01/16- done by me.  No h/o abnormal pap smears.  Last period was 2015- no h/o postmenopausal bleeding.  GERD- on Nexium 40 mg daily.  In 2008- UGI series showed GERD with hiatal hernia. Recently increased her bedtime does of famotidine per Dr. Vena Rua suggestions.  Anxiety- has been quite well controlled on Cymbalta 60 mg daily.  Denies any anxiety or depression.  Holding up well during the pandemic- has been relaxing at her home in [place].  GAD 7 : Generalized Anxiety Score 04/19/2019  Nervous, Anxious, on Edge 0  Control/stop worrying 0  Worry too much - different things 0  Trouble relaxing 0  Restless 0  Easily annoyed or irritable 0  Afraid - awful might happen 0  Total GAD 7 Score 0  Anxiety Difficulty Not difficult at all     Depression screen [facility] 2/9 04/19/2019 09/27/2017 07/02/2015  Decreased Interest 0 0 0  Down, Depressed, Hopeless 0 0 0  PHQ - 2 Score 0 0 0  Altered sleeping 0 - -  Tired, decreased energy 1 - -  Change in appetite 1 - -  Feeling bad or failure about yourself  0 - -  Trouble concentrating 0 - -  Moving slowly or fidgety/restless 0 - -  PHQ-9 Score 2 - -  Difficult doing work/chores Not difficult at all - -     HLD- when I saw her last year, her cholesterol had  deteriorated.  She had been exercising and losing weight.  Has a very strong family h/o CAD.  Brother had recently had a quadruple bypass.  We therefore started her on Crestor 5 mg daily.  Tolerating without myalgias.  Exercising. Lab Results  Component Value Date   CHOL 152 02/23/2018   HDL 56.90 02/23/2018   LDLCALC 75 02/23/2018   LDLDIRECT 134.8 05/05/2013   TRIG 101.0 02/23/2018   CHOLHDL 3 02/23/2018   The 10-year ASCVD risk score Mikey Bussing DC Jr., et al., 2013) is: 2.8%   Values used to calculate the score:     Age: 63 years     Sex: Female     Is Non-Hispanic African American: No     Diabetic: No     Tobacco smoker: No     Systolic Blood Pressure: 123XX123 mmHg     Is BP treated: No     HDL Cholesterol: 56.9 mg/dL     Total Cholesterol: 152 mg/dL  Lab Results  Component Value Date   CREATININE 0.99 02/23/2018   Lab Results  Component Value Date   WBC 3.6 (L) 12/16/2017   HGB 14.4 12/16/2017   HCT 41.7 12/16/2017   MCV 91.6 12/16/2017   PLT 303.0 12/16/2017   Lab Results  Component Value Date  TSH 1.50 12/16/2017   Current Outpatient Medications on File Prior to Visit  Medication Sig Dispense Refill  . fexofenadine (ALLEGRA ALLERGY) 60 MG tablet Take 1 tablet (60 mg total) by mouth daily. 30 tablet 3  . Multiple Vitamin (MULTIVITAMIN) tablet Take 1 tablet by mouth daily.    Marland Kitchen OVER THE COUNTER MEDICATION Black Elderberry 575 mg  2 capsules daily.    Marland Kitchen OVER THE COUNTER MEDICATION B complex vitamins one tablet daily.    Marland Kitchen OVER THE COUNTER MEDICATION Probiotic,  One capsule daily.    Marland Kitchen OVER THE COUNTER MEDICATION Calcium and vitamin D. One capsule daily.     No current facility-administered medications on file prior to visit.     No Known Allergies  Past Medical History:  Diagnosis Date  . Anxiety   . Arthritis   . Gallstones   . GERD (gastroesophageal reflux disease)   . Hiatal hernia 2010  . History of MRSA infection 2007  . History of thyroid nodule 2010   . Hyperlipemia   . Lung nodule    needs follow up in 05/2013    Past Surgical History:  Procedure Laterality Date  . BREAST BIOPSY Right 08/21/2015  . BREAST EXCISIONAL BIOPSY Left 1994   negative  . CESAREAN SECTION     x2  . COLONOSCOPY    . TONSILLECTOMY    . UPPER GASTROINTESTINAL ENDOSCOPY      Family History  Problem Relation Age of Onset  . Hypertension Mother   . Kidney disease Brother   . Irritable bowel syndrome Father   . Neuropathy Father   . Breast cancer Other   . Esophageal cancer Maternal Grandfather   . Heart disease Maternal Grandmother   . Colon cancer Neg Hx   . Rectal cancer Neg Hx   . Stomach cancer Neg Hx     Social History   Socioeconomic History  . Marital status: Married    Spouse name: Not on file  . Number of children: 2  . Years of education: Not on file  . Highest education level: Not on file  Occupational History  . Occupation: Retired  Scientific laboratory technician  . Financial resource strain: Not on file  . Food insecurity    Worry: Not on file    Inability: Not on file  . Transportation needs    Medical: Not on file    Non-medical: Not on file  Tobacco Use  . Smoking status: Former Smoker    Years: 5.00    Types: Cigarettes    Quit date: 08/17/1977    Years since quitting: 41.6  . Smokeless tobacco: Never Used  . Tobacco comment: as a teenager  Substance and Sexual Activity  . Alcohol use: Yes    Alcohol/week: 0.0 standard drinks    Comment: occ. wine or beer  . Drug use: No  . Sexual activity: Not on file  Lifestyle  . Physical activity    Days per week: Not on file    Minutes per session: Not on file  . Stress: Not on file  Relationships  . Social Herbalist on phone: Not on file    Gets together: Not on file    Attends religious service: Not on file    Active member of club or organization: Not on file    Attends meetings of clubs or organizations: Not on file    Relationship status: Not on file  . Intimate  partner violence  Fear of current or ex partner: Not on file    Emotionally abused: Not on file    Physically abused: Not on file    Forced sexual activity: Not on file  Other Topics Concern  . Not on file  Social History Narrative   Married.  Two children, 5 grandchildren.   Son, daughter in law and two grand daughters live in [place].      Recently moved to [place] from [place].   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   The PMH, PSH, Social History, Family History, Medications, and allergies have been reviewed in [facility], and have been updated if relevant.    Review of Systems  Constitutional: Negative.   HENT: Negative.   Eyes: Negative.   Respiratory: Negative.   Cardiovascular: Negative.   Gastrointestinal: Negative.   Endocrine: Negative.   Genitourinary: Negative.   Musculoskeletal: Negative.   Skin: Negative.   Allergic/Immunologic: Negative.   Neurological: Negative.   Hematological: Negative.   Psychiatric/Behavioral: Negative.   All other systems reviewed and are negative.      Objective:    BP 126/86 (BP Location: Left Arm, Patient Position: Sitting, Cuff Size: Normal)   Pulse 70   Temp 97.8 F (36.6 C) (Oral)   Ht 5' 5.75" (1.67 m)   Wt 210 lb 12.8 oz (95.6 kg)   SpO2 95%   BMI 34.28 kg/m  Wt Readings from Last 3 Encounters:  04/19/19 210 lb 12.8 oz (95.6 kg)  08/01/18 211 lb (95.7 kg)  07/18/18 211 lb 9.6 oz (96 kg)     Physical Exam    General:  Well-developed,well-nourished,in no acute distress; alert,appropriate and cooperative throughout examination Head:  normocephalic and atraumatic.   Eyes:  vision grossly intact, PERRL Ears:  R ear normal and L ear normal externally, TMs clear bilaterally Nose:  no external deformity.   Mouth:  good dentition.   Neck:  No deformities, masses, or tenderness noted. Breasts:  No mass, nodules, thickening, tenderness, bulging, retraction, inflamation, nipple discharge or skin  changes noted.   Lungs:  Normal respiratory effort, chest expands symmetrically. Lungs are clear to auscultation, no crackles or wheezes. Heart:  Normal rate and regular rhythm. S1 and S2 normal without gallop, murmur, click, rub or other extra sounds. Abdomen:  Bowel sounds positive,abdomen soft and non-tender without masses, organomegaly or hernias noted. Msk:  No deformity or scoliosis noted of thoracic or lumbar spine.   Extremities:  No clubbing, cyanosis, edema, or deformity noted with normal full range of motion of all joints.   Neurologic:  alert & oriented X3 and gait normal.   Skin:  Intact without suspicious lesions or rashes Cervical Nodes:  No lymphadenopathy noted Axillary Nodes:  No palpable lymphadenopathy Psych:  Cognition and judgment appear intact. Alert and cooperative with normal attention span and concentration. No apparent delusions, illusions, hallucinations          Assessment & Plan:   Well woman exam without gynecological exam  Screening for breast cancer - Plan: MM 3D SCREEN BREAST BILATERAL  Hyperlipidemia, unspecified hyperlipidemia type - Plan: CBC with Differential/Platelet, Comprehensive metabolic panel, Lipid panel, TSH  Vitamin D deficiency - Plan: Vitamin D (25 hydroxy)  Gastroesophageal reflux disease, esophagitis presence not specified  Anxiety  Need for influenza vaccination - Plan: Flu Vaccine QUAD 6+ mos PF IM (Fluarix Quad PF) No follow-ups on file.

## 2019-04-19 ENCOUNTER — Other Ambulatory Visit: Payer: Self-pay

## 2019-04-19 ENCOUNTER — Encounter: Payer: Self-pay | Admitting: Family Medicine

## 2019-04-19 ENCOUNTER — Ambulatory Visit (INDEPENDENT_AMBULATORY_CARE_PROVIDER_SITE_OTHER): Payer: BLUE CROSS/BLUE SHIELD | Admitting: Family Medicine

## 2019-04-19 VITALS — BP 126/86 | HR 70 | Temp 97.8°F | Ht 65.75 in | Wt 210.8 lb

## 2019-04-19 DIAGNOSIS — E785 Hyperlipidemia, unspecified: Secondary | ICD-10-CM

## 2019-04-19 DIAGNOSIS — Z23 Encounter for immunization: Secondary | ICD-10-CM

## 2019-04-19 DIAGNOSIS — E559 Vitamin D deficiency, unspecified: Secondary | ICD-10-CM | POA: Diagnosis not present

## 2019-04-19 DIAGNOSIS — K219 Gastro-esophageal reflux disease without esophagitis: Secondary | ICD-10-CM

## 2019-04-19 DIAGNOSIS — Z0000 Encounter for general adult medical examination without abnormal findings: Secondary | ICD-10-CM

## 2019-04-19 DIAGNOSIS — F419 Anxiety disorder, unspecified: Secondary | ICD-10-CM

## 2019-04-19 DIAGNOSIS — Z1239 Encounter for other screening for malignant neoplasm of breast: Secondary | ICD-10-CM | POA: Diagnosis not present

## 2019-04-19 LAB — LIPID PANEL
Cholesterol: 148 mg/dL (ref 0–200)
HDL: 57 mg/dL (ref >39.00)
LDL Cholesterol: 73 mg/dL (ref 0–99)
NonHDL: 91.31
Total CHOL/HDL Ratio: 3
Triglycerides: 94 mg/dL (ref 0.0–149.0)
VLDL: 18.8 mg/dL (ref 0.0–40.0)

## 2019-04-19 LAB — COMPREHENSIVE METABOLIC PANEL WITH GFR
ALT: 23 U/L (ref 0–35)
AST: 24 U/L (ref 0–37)
Albumin: 4.2 g/dL (ref 3.5–5.2)
Alkaline Phosphatase: 46 U/L (ref 39–117)
BUN: 15 mg/dL (ref 6–23)
CO2: 29 meq/L (ref 19–32)
Calcium: 9.1 mg/dL (ref 8.4–10.5)
Chloride: 104 meq/L (ref 96–112)
Creatinine, Ser: 0.95 mg/dL (ref 0.40–1.20)
GFR: 59.8 mL/min — ABNORMAL LOW (ref >60.00)
Glucose, Bld: 78 mg/dL (ref 70–99)
Potassium: 3.9 meq/L (ref 3.5–5.1)
Sodium: 139 meq/L (ref 135–145)
Total Bilirubin: 0.4 mg/dL (ref 0.2–1.2)
Total Protein: 6.8 g/dL (ref 6.0–8.3)

## 2019-04-19 LAB — CBC WITH DIFFERENTIAL/PLATELET
Basophils Absolute: 0 K/uL (ref 0.0–0.1)
Basophils Relative: 0.8 % (ref 0.0–3.0)
Eosinophils Absolute: 0.2 K/uL (ref 0.0–0.7)
Eosinophils Relative: 3.6 % (ref 0.0–5.0)
HCT: 41.8 % (ref 36.0–46.0)
Hemoglobin: 14.2 g/dL (ref 12.0–15.0)
Lymphocytes Relative: 30.5 % (ref 12.0–46.0)
Lymphs Abs: 1.3 K/uL (ref 0.7–4.0)
MCHC: 33.9 g/dL (ref 30.0–36.0)
MCV: 91.9 fl (ref 78.0–100.0)
Monocytes Absolute: 0.4 K/uL (ref 0.1–1.0)
Monocytes Relative: 9.7 % (ref 3.0–12.0)
Neutro Abs: 2.3 K/uL (ref 1.4–7.7)
Neutrophils Relative %: 55.4 % (ref 43.0–77.0)
Platelets: 265 K/uL (ref 150.0–400.0)
RBC: 4.55 Mil/uL (ref 3.87–5.11)
RDW: 13.7 % (ref 11.5–15.5)
WBC: 4.2 K/uL (ref 4.0–10.5)

## 2019-04-19 LAB — TSH: TSH: 1.38 u[IU]/mL (ref 0.35–4.50)

## 2019-04-19 LAB — VITAMIN D 25 HYDROXY (VIT D DEFICIENCY, FRACTURES): VITD: 41.91 ng/mL (ref 30.00–100.00)

## 2019-04-19 MED ORDER — ESOMEPRAZOLE MAGNESIUM 40 MG PO CPDR: 40.0000 mg | DELAYED_RELEASE_CAPSULE | Freq: Every day | ORAL | 3 refills | Status: DC

## 2019-04-19 MED ORDER — DULOXETINE HCL 60 MG PO CPEP: ORAL_CAPSULE | ORAL | 3 refills | Status: AC

## 2019-04-19 MED ORDER — FAMOTIDINE 20 MG PO TABS: 20.0000 mg | ORAL_TABLET | Freq: Two times a day (BID) | ORAL | 3 refills | Status: AC

## 2019-04-19 MED ORDER — ROSUVASTATIN CALCIUM 5 MG PO TABS: 5.0000 mg | ORAL_TABLET | Freq: Every day | ORAL | 3 refills | Status: AC

## 2019-04-19 NOTE — Assessment & Plan Note (Signed)
On crestor daily. Due for labs.  Orders Placed This Encounter  Procedures  . MM 3D SCREEN BREAST BILATERAL  . Flu Vaccine QUAD 6+ mos PF IM (Fluarix Quad PF)  . CBC with Differential/Platelet  . Comprehensive metabolic panel  . Lipid panel  . TSH  . Vitamin D (25 hydroxy)

## 2019-04-19 NOTE — Assessment & Plan Note (Signed)
Repeat Vitamin D today. 

## 2019-04-19 NOTE — Assessment & Plan Note (Addendum)
Reviewed preventive care protocols, scheduled due services, and updated immunizations Discussed nutrition, exercise, diet, and healthy lifestyle.  Mammogram ordered and phone number for breast center given to pt to schedule her own mammogram.  Influenza vaccine given today.

## 2019-04-19 NOTE — Assessment & Plan Note (Signed)
Has already improved after two nights of increased famotidine at bedtime.

## 2019-04-19 NOTE — Assessment & Plan Note (Addendum)
Well controlled on current dose of Cymbalta. No changes made.  eRx refills sent.  GAD 7 : Generalized Anxiety Score 04/19/2019  Nervous, Anxious, on Edge 0  Control/stop worrying 0  Worry too much - different things 0  Trouble relaxing 0  Restless 0  Easily annoyed or irritable 0  Afraid - awful might happen 0  Total GAD 7 Score 0  Anxiety Difficulty Not difficult at all

## 2019-06-13 ENCOUNTER — Telehealth: Payer: Self-pay

## 2019-06-13 NOTE — Telephone Encounter (Signed)
I LM for pt to call office/also offered a virtual visit/PEC if pt calls offer a virtual visit for her headache that is ongoing for a while/thx dmf

## 2019-06-14 ENCOUNTER — Ambulatory Visit (INDEPENDENT_AMBULATORY_CARE_PROVIDER_SITE_OTHER): Payer: BLUE CROSS/BLUE SHIELD | Admitting: Family Medicine

## 2019-06-14 ENCOUNTER — Other Ambulatory Visit: Payer: Self-pay

## 2019-06-14 ENCOUNTER — Encounter: Payer: Self-pay | Admitting: Family Medicine

## 2019-06-14 DIAGNOSIS — G8929 Other chronic pain: Secondary | ICD-10-CM | POA: Diagnosis not present

## 2019-06-14 DIAGNOSIS — R519 Headache, unspecified: Secondary | ICD-10-CM | POA: Diagnosis not present

## 2019-06-14 MED ORDER — FLUTICASONE PROPIONATE 50 MCG/ACT NA SUSP: 2.0000 | Freq: Every day | NASAL | 6 refills | Status: DC

## 2019-06-14 NOTE — Assessment & Plan Note (Signed)
>  25 minutes spent in face to face time with patient, >50% spent in counselling or coordination of care.  We discussed different types of headaches- sinus (will add flonase to her allegra), tension, migraines.  No red flag symptoms.  She will keep a headache journal and update me in few weeks, sooner if she has new symptoms or concerns.

## 2019-06-14 NOTE — Patient Instructions (Signed)
Health Maintenance Due  Topic Date Due  . MAMMOGRAM  11/11/2018    Depression screen [facility] 2/9 04/19/2019 09/27/2017 07/02/2015  Decreased Interest 0 0 0  Down, Depressed, Hopeless 0 0 0  PHQ - 2 Score 0 0 0  Altered sleeping 0 - -  Tired, decreased energy 1 - -  Change in appetite 1 - -  Feeling bad or failure about yourself  0 - -  Trouble concentrating 0 - -  Moving slowly or fidgety/restless 0 - -  PHQ-9 Score 2 - -  Difficult doing work/chores Not difficult at all - -

## 2019-06-14 NOTE — Progress Notes (Signed)
TELEPHONE ENCOUNTER   Patient verbally agreed to telephone visit and is aware that copayment and coinsurance may apply. Patient was treated using telemedicine according to accepted telemedicine protocols.  Location of the patient: Patient's home               Location of provider: Provider's office  Names of all persons participating in the telemedicine service and role in the encounter: Arnette Norris, MD [place]:   Chief Complaint  Patient presents with  . Headache    x 4 weeks     HPI   Headache- foggy headache, intermittent for four weeks.   Headache is sometimes in he back of her head, sometimes it is in the front and feels like a sinus headache because is worse.  Non tender to palpation when she presses on her sinuses.  Headaches are not associated with sensitivity to light but sometimes she is sensitive to sound..  No nausea or vomiting.  Headaches are dull- tylenol helps.    No other neurological symptoms.  On allegra for sinuses.   Patient Active Problem List   Diagnosis Date Noted  . Well woman exam without gynecological exam 12/29/2017  . History of MRSA infection 09/27/2017  . Plantar fasciitis, bilateral 05/24/2017  . HLD (hyperlipidemia) 01/17/2013  . Vitamin D deficiency 10/03/2012  . Anxiety   . Gallstones   . GERD (gastroesophageal reflux disease)   . Lung nodule    Social History   Tobacco Use  . Smoking status: Former Smoker    Years: 5.00    Types: Cigarettes    Quit date: 08/17/1977    Years since quitting: 41.8  . Smokeless tobacco: Never Used  . Tobacco comment: as a teenager  Substance Use Topics  . Alcohol use: Yes    Alcohol/week: 0.0 standard drinks    Comment: occ. wine or beer    Current Outpatient Medications:  .  acetaminophen (TYLENOL) 500 MG tablet, Take 500 mg by mouth every 6 (six) hours as needed., Disp: , Rfl:  .  DULoxetine (CYMBALTA) 60 MG capsule, Take 1qd, Disp: 90 capsule, Rfl: 3 .  esomeprazole  (NEXIUM) 40 MG capsule, Take 1 capsule (40 mg total) by mouth daily before breakfast., Disp: 90 capsule, Rfl: 3 .  famotidine (PEPCID) 20 MG tablet, Take 1 tablet (20 mg total) by mouth 2 (two) times daily., Disp: 180 tablet, Rfl: 3 .  fexofenadine (ALLEGRA ALLERGY) 60 MG tablet, Take 1 tablet (60 mg total) by mouth daily., Disp: 30 tablet, Rfl: 3 .  Multiple Vitamin (MULTIVITAMIN) tablet, Take 1 tablet by mouth daily., Disp: , Rfl:  .  OVER THE COUNTER MEDICATION, Black Elderberry 575 mg  2 capsules daily., Disp: , Rfl:  .  OVER THE COUNTER MEDICATION, B complex vitamins one tablet daily., Disp: , Rfl:  .  OVER THE COUNTER MEDICATION, Probiotic,  One capsule daily., Disp: , Rfl:  .  OVER THE COUNTER MEDICATION, Calcium and vitamin D. One capsule daily., Disp: , Rfl:  .  rosuvastatin (CRESTOR) 5 MG tablet, Take 1 tablet (5 mg total) by mouth daily., Disp: 90 tablet, Rfl: 3 No Known Allergies  Assessment & Plan:   No diagnosis found.  No orders of the defined types were placed in this encounter.  No orders of the defined types were placed in this encounter.   Arnette Norris, MD 06/14/2019  Time spent with the patient: 25 minutes, spent in obtaining information about her symptoms, reviewing her previous labs,  evaluations, and treatments, counseling her about her condition (please see the discussed topics above), and developing a plan to further investigate it; she had a number of questions which I addressed.   [id number] physician/qualified health professional telephone evaluation 5 to 10 minutes [id number] physician/qualified help functional [place] evaluation for 11 to 20 minutes [id number] physician/qualify he will professional telephone evaluation for 21 to 30 minutes

## 2019-06-23 ENCOUNTER — Other Ambulatory Visit (HOSPITAL_COMMUNITY): Payer: Self-pay | Admitting: Unknown Physician Specialty

## 2019-06-23 ENCOUNTER — Other Ambulatory Visit: Payer: Self-pay | Admitting: Unknown Physician Specialty

## 2019-06-23 DIAGNOSIS — R519 Headache, unspecified: Secondary | ICD-10-CM

## 2019-06-26 ENCOUNTER — Telehealth: Payer: Self-pay | Admitting: Family Medicine

## 2019-06-26 NOTE — Telephone Encounter (Signed)
Called patient back- She is started having noise in her ears that started two Thursdays ago.  Called her ENT and he got some wax out of her left ear.  Unfortunately it didn't help with the noise in her ear. Did not find any food triggers- just feels headaches are worse.  Dr. Tami Ribas ordered an MRI which is scheduled for 07/05/19.  No sharp shooting pain.  HA is relieved by tylenol.  She is sensitive to light and sound now.  No visual changes. I told her that given these new symptoms, she should proceed with an MRI of her brain.  She states she will call Dr. Tami Ribas to let him know.

## 2019-06-26 NOTE — Telephone Encounter (Signed)
Copied from [place] [id number]. Topic: General - Other >> Jun 26, 2019  1:55 PM Burchel, Abbi R wrote: Reason for CRM: pt has been keeping a food/headache diary for 1 wk per Dr Hulen Shouts orders and would like a call from Dr Deborra Medina to discuss it. Please call pt today.  [phone]

## 2019-07-05 ENCOUNTER — Ambulatory Visit: Payer: BLUE CROSS/BLUE SHIELD

## 2019-07-27 ENCOUNTER — Encounter: Payer: Self-pay | Admitting: Family Medicine

## 2019-08-21 ENCOUNTER — Encounter: Payer: Self-pay | Admitting: Family Medicine

## 2019-08-22 NOTE — Telephone Encounter (Signed)
FYI

## 2019-10-09 ENCOUNTER — Other Ambulatory Visit: Payer: Self-pay | Admitting: Internal Medicine

## 2019-10-09 DIAGNOSIS — Z1231 Encounter for screening mammogram for malignant neoplasm of breast: Secondary | ICD-10-CM

## 2019-10-27 ENCOUNTER — Ambulatory Visit
Admission: RE | Admit: 2019-10-27 | Discharge: 2019-10-27 | Disposition: A | Discharge status: Home / Self Care | Payer: BLUE CROSS/BLUE SHIELD | Source: Ambulatory Visit | Attending: Internal Medicine | Admitting: Internal Medicine

## 2019-10-27 DIAGNOSIS — Z1231 Encounter for screening mammogram for malignant neoplasm of breast: Secondary | ICD-10-CM | POA: Diagnosis not present

## 2019-10-30 ENCOUNTER — Other Ambulatory Visit: Payer: Self-pay | Admitting: Neurology

## 2019-10-30 DIAGNOSIS — R519 Headache, unspecified: Secondary | ICD-10-CM

## 2019-10-30 DIAGNOSIS — G8929 Other chronic pain: Secondary | ICD-10-CM

## 2019-11-09 ENCOUNTER — Ambulatory Visit: Payer: BLUE CROSS/BLUE SHIELD

## 2019-11-10 ENCOUNTER — Ambulatory Visit: Payer: BLUE CROSS/BLUE SHIELD | Attending: Internal Medicine

## 2019-11-10 ENCOUNTER — Other Ambulatory Visit: Payer: Self-pay

## 2019-11-10 DIAGNOSIS — Z23 Encounter for immunization: Secondary | ICD-10-CM

## 2019-11-10 NOTE — Progress Notes (Signed)
   Covid-19 Vaccination Clinic  Name:  Debralee Winiarski    MRN: [id number] DOB: August 26, 1955  11/10/2019  Ms. Mcmurrey was observed post Covid-19 immunization for 15 minutes without incident. She was provided with Vaccine Information Sheet and instruction to access the V-Safe system.   Ms. Shiverdecker was instructed to call 911 with any severe reactions post vaccine: Marland Kitchen Difficulty breathing  . Swelling of face and throat  . A fast heartbeat  . A bad rash all over body  . Dizziness and weakness   Immunizations Administered    Name Date Dose VIS Date Route   Pfizer COVID-19 Vaccine 11/10/2019  1:38 PM 0.3 mL 07/28/2019 Intramuscular   Manufacturer: [place]   Lot: [id number]   [place]: [id number]

## 2019-12-06 ENCOUNTER — Ambulatory Visit: Payer: BLUE CROSS/BLUE SHIELD | Attending: Internal Medicine

## 2019-12-06 DIAGNOSIS — Z23 Encounter for immunization: Secondary | ICD-10-CM

## 2019-12-06 NOTE — Progress Notes (Signed)
   Covid-19 Vaccination Clinic  Name:  Jeroldine Prime    MRN: [id number] DOB: [birth year]  12/06/2019  Ms. Mccarver was observed post Covid-19 immunization for 15 minutes without incident. She was provided with Vaccine Information Sheet and instruction to access the V-Safe system.   Ms. Rogacki was instructed to call 911 with any severe reactions post vaccine: Marland Kitchen Difficulty breathing  . Swelling of face and throat  . A fast heartbeat  . A bad rash all over body  . Dizziness and weakness   Immunizations Administered    Name Date Dose VIS Date Route   Pfizer COVID-19 Vaccine 12/06/2019  1:59 PM 0.3 mL 10/11/2018 Intramuscular   Manufacturer: [employer]   Lot: [id number]   [place]: [id number]

## 2020-01-09 IMAGING — MG MM DIGITAL SCREENING BILAT W/ CAD
5 series · 5 of 5 positions shown · non-contrast
Comparison: Previous exam(s).

CLINICAL DATA: Screening.

EXAM:
DIGITAL SCREENING BILATERAL MAMMOGRAM WITH CAD

[R XCCL]
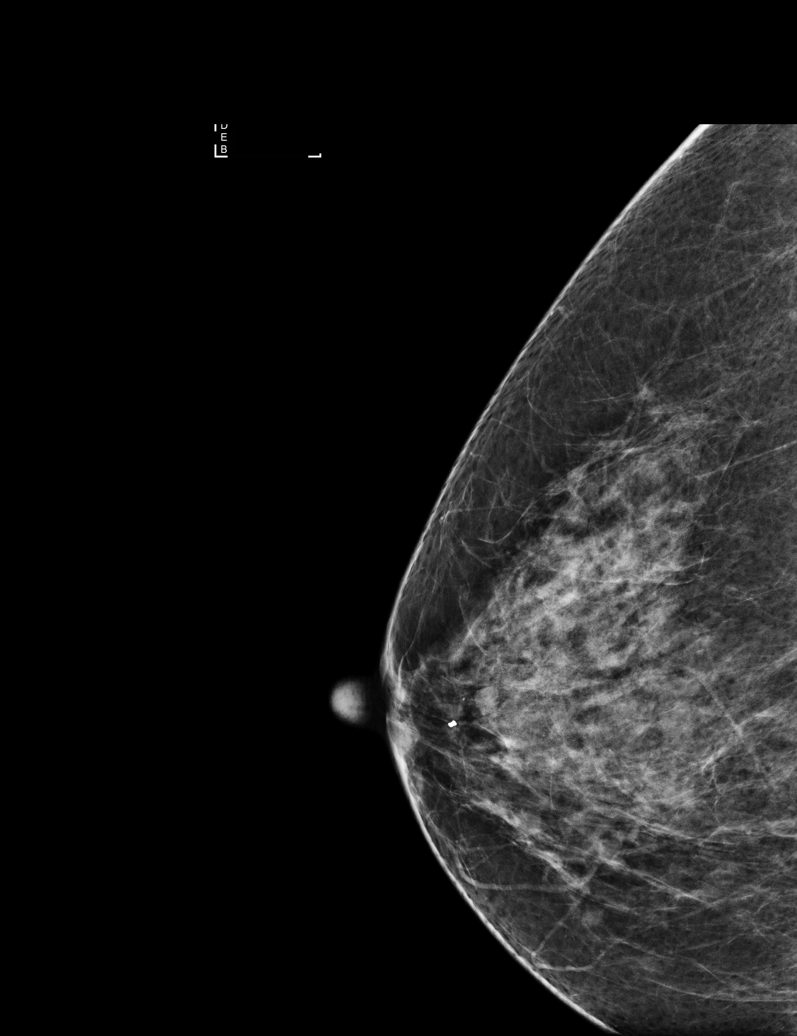

[R CC]
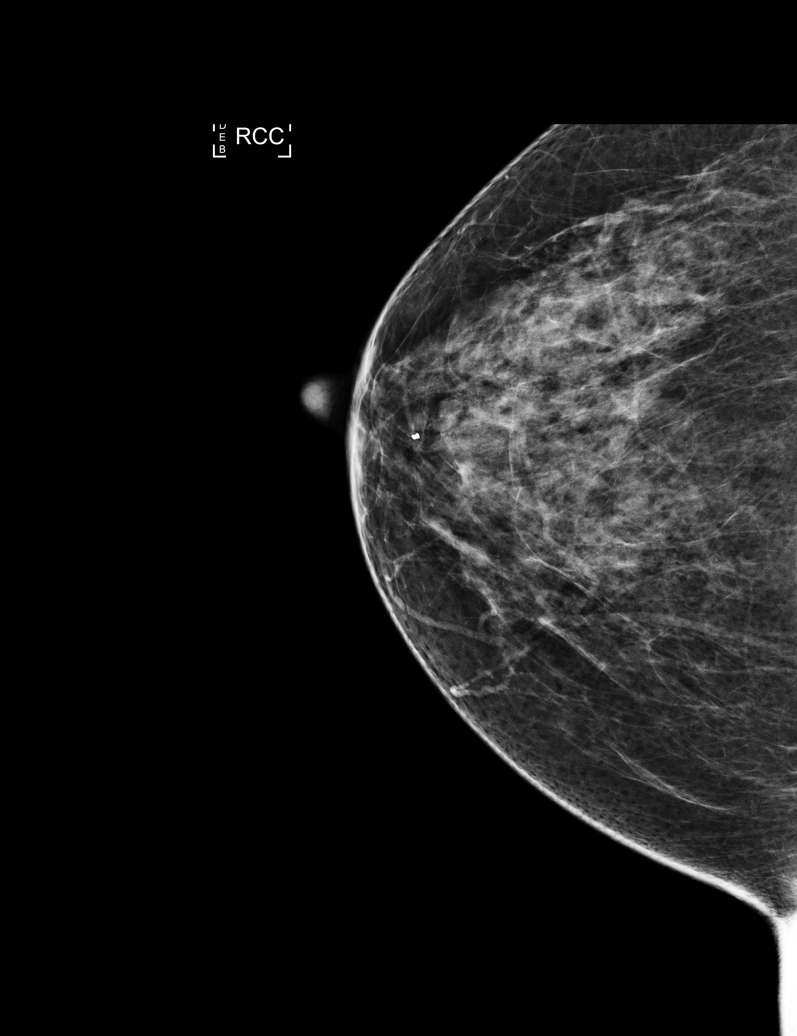

[L CC]
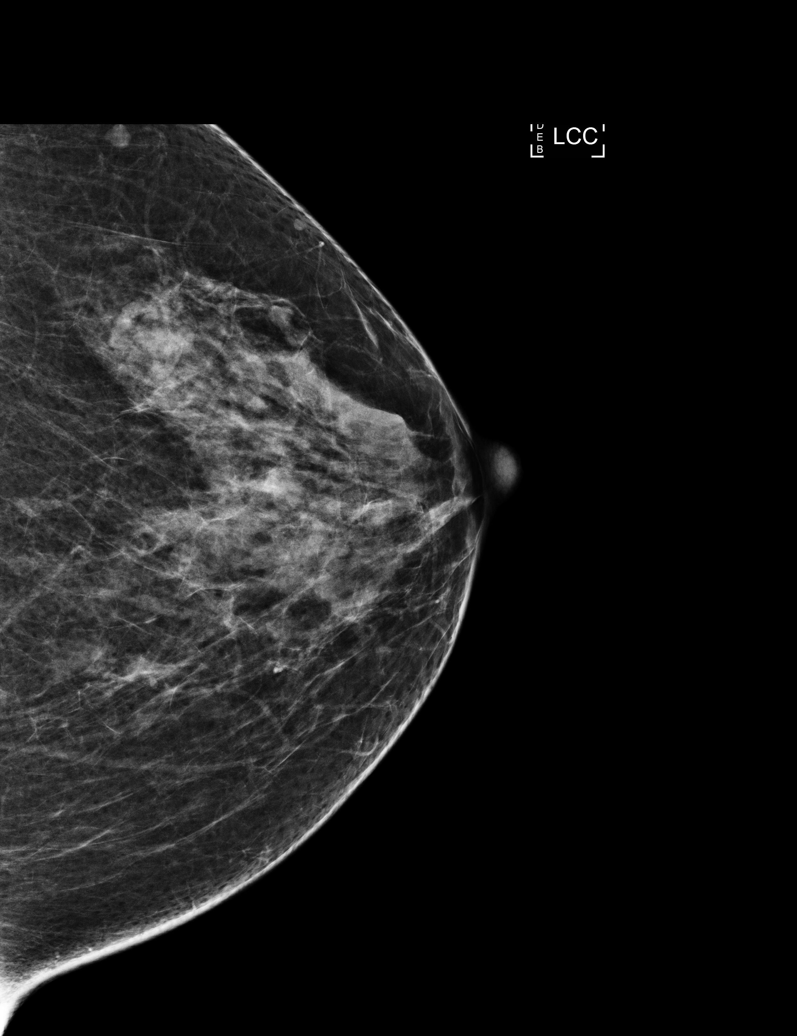

[L MLO]
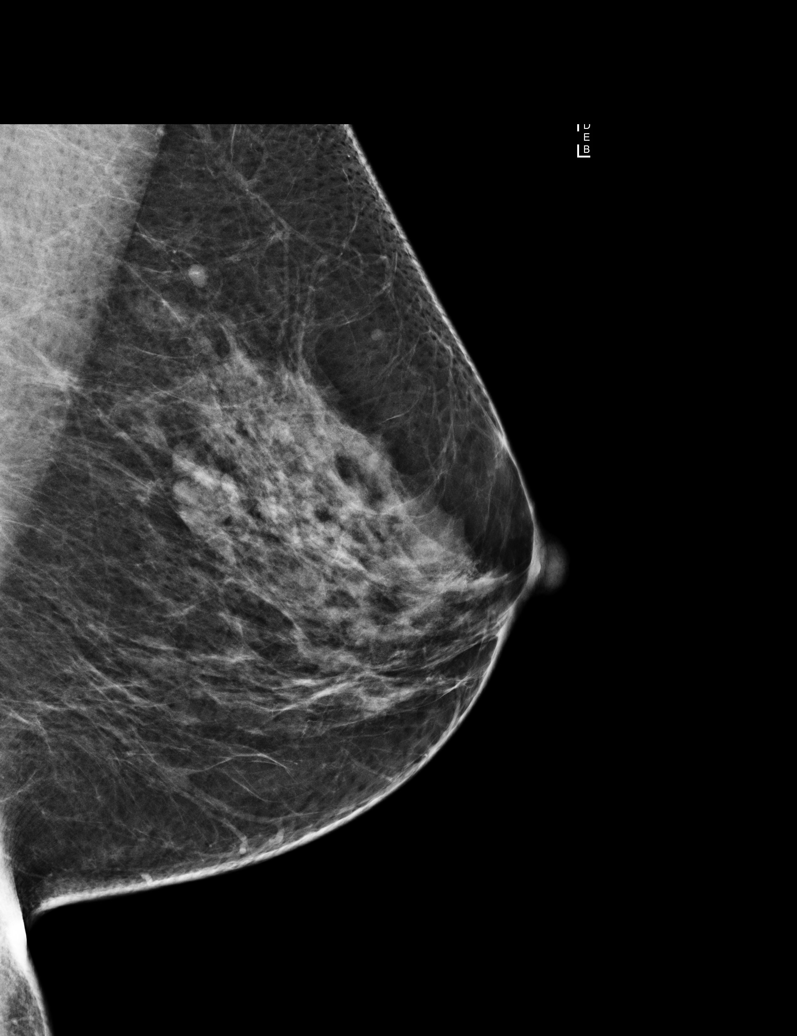

[R MLO]
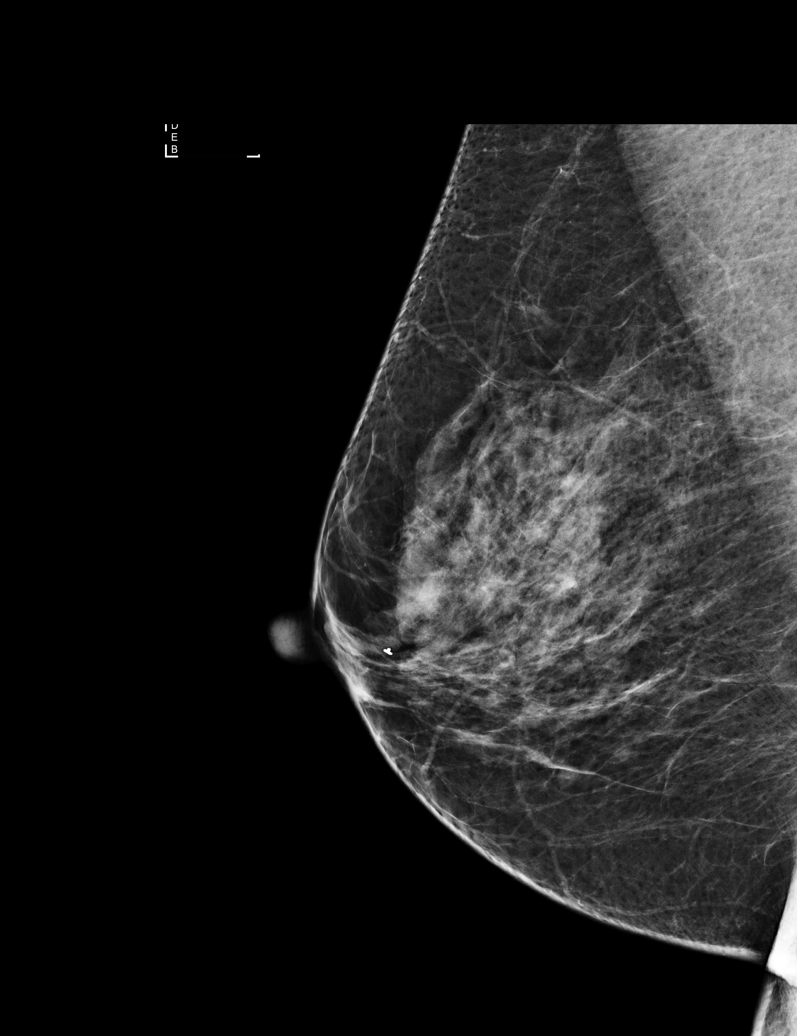

[5 of 5 positions shown; findings below may reference images not displayed]

ACR Breast Density Category c: The breast tissue is heterogeneously
dense, which may obscure small masses.
FINDINGS: There are no findings suspicious for malignancy. Images were
processed with CAD.
IMPRESSION: No mammographic evidence of malignancy. A result letter of this
screening mammogram will be mailed directly to the patient.

RECOMMENDATION:
Screening mammogram in one year. (Code:[id number])

BI-RADS CATEGORY  1: Negative.

## 2020-11-15 ENCOUNTER — Other Ambulatory Visit: Payer: Self-pay | Admitting: Internal Medicine

## 2020-11-15 DIAGNOSIS — Z1231 Encounter for screening mammogram for malignant neoplasm of breast: Secondary | ICD-10-CM

## 2020-11-27 ENCOUNTER — Other Ambulatory Visit: Payer: Self-pay

## 2020-11-27 ENCOUNTER — Ambulatory Visit
Admission: RE | Admit: 2020-11-27 | Discharge: 2020-11-27 | Disposition: A | Discharge status: Home / Self Care | Payer: BLUE CROSS/BLUE SHIELD | Source: Ambulatory Visit | Attending: Internal Medicine | Admitting: Internal Medicine

## 2020-11-27 DIAGNOSIS — Z1231 Encounter for screening mammogram for malignant neoplasm of breast: Secondary | ICD-10-CM | POA: Diagnosis present

## 2020-12-03 ENCOUNTER — Encounter: Payer: Self-pay | Admitting: Obstetrics and Gynecology

## 2021-09-24 DIAGNOSIS — Z780 Asymptomatic menopausal state: Secondary | ICD-10-CM | POA: Diagnosis not present

## 2021-09-24 DIAGNOSIS — Z0000 Encounter for general adult medical examination without abnormal findings: Secondary | ICD-10-CM | POA: Diagnosis not present

## 2021-10-01 DIAGNOSIS — R69 Illness, unspecified: Secondary | ICD-10-CM | POA: Diagnosis not present

## 2021-10-01 DIAGNOSIS — E785 Hyperlipidemia, unspecified: Secondary | ICD-10-CM | POA: Diagnosis not present

## 2021-10-01 DIAGNOSIS — Z0000 Encounter for general adult medical examination without abnormal findings: Secondary | ICD-10-CM | POA: Diagnosis not present

## 2021-10-01 DIAGNOSIS — D72819 Decreased white blood cell count, unspecified: Secondary | ICD-10-CM | POA: Diagnosis not present

## 2021-10-01 DIAGNOSIS — D703 Neutropenia due to infection: Secondary | ICD-10-CM | POA: Diagnosis not present

## 2021-10-24 DIAGNOSIS — D72819 Decreased white blood cell count, unspecified: Secondary | ICD-10-CM | POA: Insufficient documentation

## 2021-10-24 NOTE — Progress Notes (Unsigned)
[place]  [phone] [phone]) [phone]  ID: Monica Carrillo OB: 24-Oct-1955  MR#: [id number]  JME#:[id number]  Patient Care Team: Gladstone Lighter, MD as PCP - General (Internal Medicine)  CHIEF COMPLAINT: Leukopenia.  INTERVAL HISTORY: ***  REVIEW OF SYSTEMS:   ROS  As per HPI. Otherwise, a complete review of systems is negative.  PAST MEDICAL HISTORY: Past Medical History:  Diagnosis Date   Anxiety    Arthritis    Gallstones    GERD (gastroesophageal reflux disease)    Hiatal hernia 2010   History of MRSA infection 2007   History of thyroid nodule 2010   Hyperlipemia    Lung nodule    needs follow up in 05/2013    PAST SURGICAL HISTORY: Past Surgical History:  Procedure Laterality Date   BREAST BIOPSY Right 08/21/2015   BREAST EXCISIONAL BIOPSY Left 1994   negative   CESAREAN SECTION     x2   COLONOSCOPY     TONSILLECTOMY     UPPER GASTROINTESTINAL ENDOSCOPY      FAMILY HISTORY: Family History  Problem Relation Age of Onset   Hypertension Mother    Kidney disease Brother    Irritable bowel syndrome Father    Neuropathy Father    Breast cancer Other    Esophageal cancer Maternal Grandfather    Heart disease Maternal Grandmother    Colon cancer Neg Hx    Rectal cancer Neg Hx    Stomach cancer Neg Hx     ADVANCED DIRECTIVES (Y/N):  N  HEALTH MAINTENANCE: Social History   Tobacco Use   Smoking status: Former    Years: 5.00    Types: Cigarettes    Quit date: 08/17/1977    Years since quitting: 44.2   Smokeless tobacco: Never   Tobacco comments:    as a teenager  Vaping Use   Vaping Use: Never used  Substance Use Topics   Alcohol use: Yes    Alcohol/week: 0.0 standard drinks    Comment: occ. wine or beer   Drug use: No     Colonoscopy:  PAP:  Bone density:  Lipid panel:  No Known Allergies  Current Outpatient Medications  Medication Sig Dispense Refill   acetaminophen (TYLENOL) 500 MG tablet  Take 500 mg by mouth every 6 (six) hours as needed.     DULoxetine (CYMBALTA) 60 MG capsule Take 1qd 90 capsule 3   esomeprazole (NEXIUM) 40 MG capsule Take 1 capsule (40 mg total) by mouth daily before breakfast. 90 capsule 3   famotidine (PEPCID) 20 MG tablet Take 1 tablet (20 mg total) by mouth 2 (two) times daily. 180 tablet 3   fexofenadine (ALLEGRA ALLERGY) 60 MG tablet Take 1 tablet (60 mg total) by mouth daily. 30 tablet 3   fluticasone (FLONASE) 50 MCG/ACT nasal spray Place 2 sprays into both nostrils daily. 16 g 6   Multiple Vitamin (MULTIVITAMIN) tablet Take 1 tablet by mouth daily.     OVER THE COUNTER MEDICATION Black Elderberry 575 mg  2 capsules daily.     OVER THE COUNTER MEDICATION B complex vitamins one tablet daily.     OVER THE COUNTER MEDICATION Probiotic,  One capsule daily.     OVER THE COUNTER MEDICATION Calcium and vitamin D. One capsule daily.     rosuvastatin (CRESTOR) 5 MG tablet Take 1 tablet (5 mg total) by mouth daily. 90 tablet 3   No current facility-administered medications for this visit.    OBJECTIVE: There were  no vitals filed for this visit.   There is no height or weight on file to calculate BMI.    ECOG FS:{CHL ONC [id number]  General: Well-developed, well-nourished, no acute distress. Eyes: Pink conjunctiva, anicteric sclera. HEENT: Normocephalic, moist mucous membranes. Lungs: No audible wheezing or coughing. Heart: Regular rate and rhythm. Abdomen: Soft, nontender, no obvious distention. Musculoskeletal: No edema, cyanosis, or clubbing. Neuro: Alert, answering all questions appropriately. Cranial nerves grossly intact. Skin: No rashes or petechiae noted. Psych: Normal affect. Lymphatics: No cervical, calvicular, axillary or inguinal LAD.   LAB RESULTS:  Lab Results  Component Value Date   NA 139 04/19/2019   K 3.9 04/19/2019   CL 104 04/19/2019   CO2 29 04/19/2019   GLUCOSE 78 04/19/2019   BUN 15 04/19/2019   CREATININE 0.95  04/19/2019   CALCIUM 9.1 04/19/2019   PROT 6.8 04/19/2019   ALBUMIN 4.2 04/19/2019   AST 24 04/19/2019   ALT 23 04/19/2019   ALKPHOS 46 04/19/2019   BILITOT 0.4 04/19/2019    Lab Results  Component Value Date   WBC 4.2 04/19/2019   NEUTROABS 2.3 04/19/2019   HGB 14.2 04/19/2019   HCT 41.8 04/19/2019   MCV 91.9 04/19/2019   PLT 265.0 04/19/2019     STUDIES: No results found.  ASSESSMENT: Leukopenia.  PLAN:    Leukopenia:  Patient expressed understanding and was in agreement with this plan. She also understands that She can call clinic at any time with any questions, concerns, or complaints.    Cancer Staging  No matching staging information was found for the patient.  Lloyd Huger, MD   10/24/2021 9:36 AM

## 2021-10-30 ENCOUNTER — Inpatient Hospital Stay: Payer: 59 | Attending: Oncology | Admitting: Oncology

## 2021-10-30 ENCOUNTER — Other Ambulatory Visit: Payer: Self-pay

## 2021-10-30 ENCOUNTER — Inpatient Hospital Stay: Payer: 59

## 2021-10-30 ENCOUNTER — Encounter: Payer: Self-pay | Admitting: Oncology

## 2021-10-30 DIAGNOSIS — Z8719 Personal history of other diseases of the digestive system: Secondary | ICD-10-CM | POA: Diagnosis not present

## 2021-10-30 DIAGNOSIS — Z820 Family history of epilepsy and other diseases of the nervous system: Secondary | ICD-10-CM | POA: Diagnosis not present

## 2021-10-30 DIAGNOSIS — Z803 Family history of malignant neoplasm of breast: Secondary | ICD-10-CM | POA: Diagnosis not present

## 2021-10-30 DIAGNOSIS — Z79899 Other long term (current) drug therapy: Secondary | ICD-10-CM | POA: Diagnosis not present

## 2021-10-30 DIAGNOSIS — Z8614 Personal history of Methicillin resistant Staphylococcus aureus infection: Secondary | ICD-10-CM | POA: Diagnosis not present

## 2021-10-30 DIAGNOSIS — Z87891 Personal history of nicotine dependence: Secondary | ICD-10-CM

## 2021-10-30 DIAGNOSIS — Z8639 Personal history of other endocrine, nutritional and metabolic disease: Secondary | ICD-10-CM

## 2021-10-30 DIAGNOSIS — Z8379 Family history of other diseases of the digestive system: Secondary | ICD-10-CM | POA: Diagnosis not present

## 2021-10-30 DIAGNOSIS — Z841 Family history of disorders of kidney and ureter: Secondary | ICD-10-CM | POA: Diagnosis not present

## 2021-10-30 DIAGNOSIS — Z800 Family history of malignant neoplasm of digestive organs: Secondary | ICD-10-CM | POA: Diagnosis not present

## 2021-10-30 DIAGNOSIS — Z8249 Family history of ischemic heart disease and other diseases of the circulatory system: Secondary | ICD-10-CM | POA: Diagnosis not present

## 2021-10-30 DIAGNOSIS — D72819 Decreased white blood cell count, unspecified: Secondary | ICD-10-CM | POA: Diagnosis not present

## 2021-10-30 LAB — CBC WITH DIFFERENTIAL/PLATELET
Abs Immature Granulocytes: 0.02 10*3/uL (ref 0.00–0.07)
Basophils Absolute: 0 10*3/uL (ref 0.0–0.1)
Basophils Relative: 0 %
Eosinophils Absolute: 0.3 10*3/uL (ref 0.0–0.5)
Eosinophils Relative: 4 %
HCT: 42.9 % (ref 36.0–46.0)
Hemoglobin: 14.5 g/dL (ref 12.0–15.0)
Immature Granulocytes: 0 %
Lymphocytes Relative: 20 %
Lymphs Abs: 1.2 10*3/uL (ref 0.7–4.0)
MCH: 31.3 pg (ref 26.0–34.0)
MCHC: 33.8 g/dL (ref 30.0–36.0)
MCV: 92.7 fL (ref 80.0–100.0)
Monocytes Absolute: 0.4 10*3/uL (ref 0.1–1.0)
Monocytes Relative: 8 %
Neutro Abs: 3.9 10*3/uL (ref 1.7–7.7)
Neutrophils Relative %: 68 %
Platelets: 256 10*3/uL (ref 150–400)
RBC: 4.63 MIL/uL (ref 3.87–5.11)
RDW: 12.6 % (ref 11.5–15.5)
WBC: 5.8 10*3/uL (ref 4.0–10.5)
nRBC: 0 % (ref 0.0–0.2)

## 2021-10-30 LAB — COMPREHENSIVE METABOLIC PANEL
ALT: 21 U/L (ref 0–44)
AST: 22 U/L (ref 15–41)
Albumin: 4.2 g/dL (ref 3.5–5.0)
Alkaline Phosphatase: 43 U/L (ref 38–126)
Anion gap: 8 (ref 5–15)
BUN: 18 mg/dL (ref 8–23)
CO2: 28 mmol/L (ref 22–32)
Calcium: 9.5 mg/dL (ref 8.9–10.3)
Chloride: 102 mmol/L (ref 98–111)
Creatinine, Ser: 0.82 mg/dL (ref 0.44–1.00)
GFR, Estimated: >60 mL/min (ref >60)
Glucose, Bld: 89 mg/dL (ref 70–99)
Potassium: 4.8 mmol/L (ref 3.5–5.1)
Sodium: 138 mmol/L (ref 135–145)
Total Bilirubin: 0.3 mg/dL (ref 0.3–1.2)
Total Protein: 7.3 g/dL (ref 6.5–8.1)

## 2021-11-03 LAB — COMP PANEL: LEUKEMIA/LYMPHOMA

## 2021-11-04 LAB — NEUTROPHIL AB TEST LEVEL 1: NEUTROPHIL SCR/PANEL INTERP.: NEGATIVE

## 2021-11-16 NOTE — Progress Notes (Signed)
?[place]  ?[phone] [phone] ? ?ID: Monica Carrillo OB: 1956-07-02  MR#: [id number]  XTG#:[id number] ? ?Patient Care Team: ?Gladstone Lighter, MD as PCP - General (Internal Medicine) ? ?I connected with Monica Carrillo on 11/24/21 at  3:30 PM EDT by video enabled telemedicine visit and verified that I am speaking with the correct person using two identifiers.  ? ?I discussed the limitations, risks, security and privacy concerns of performing an evaluation and management service by telemedicine and the availability of in-person appointments. I also discussed with the patient that there may be a patient responsible charge related to this service. The patient expressed understanding and agreed to proceed.  ? ?Other persons participating in the visit and their role in the encounter: Patient, MD. ? ?Patient?s location: Home. ?Provider?s location: Clinic. ? ?CHIEF COMPLAINT: Leukopenia, resolved. ? ?INTERVAL HISTORY: Patient agreed to video assisted telemedicine visit for further evaluation and discussion of her laboratory results.  She continues to feel well and remains asymptomatic.  She has no neurologic complaints.  She denies any recent fevers or illnesses.  She has no new medications.  She has a good appetite and denies weight loss.  She has no chest pain, shortness of breath, cough, or hemoptysis.  She denies any nausea, vomiting, constipation, or diarrhea.  She has no urinary complaints.  Patient offers no specific complaints today. ? ?REVIEW OF SYSTEMS:   ?Review of Systems  ?Constitutional: Negative.  Negative for fever, malaise/fatigue and weight loss.  ?Respiratory: Negative.  Negative for cough, hemoptysis and shortness of breath.   ?Cardiovascular: Negative.  Negative for chest pain and leg swelling.  ?Gastrointestinal: Negative.  Negative for abdominal pain.  ?Genitourinary: Negative.  Negative for dysuria.  ?Musculoskeletal: Negative.  Negative for back  pain.  ?Skin: Negative.  Negative for rash.  ?Neurological: Negative.  Negative for dizziness, focal weakness, weakness and headaches.  ?Psychiatric/Behavioral: Negative.  The patient is not nervous/anxious.   ? ?As per HPI. Otherwise, a complete review of systems is negative. ? ?PAST MEDICAL HISTORY: ?Past Medical History:  ?Diagnosis Date  ? Anxiety   ? Arthritis   ? Gallstones   ? GERD (gastroesophageal reflux disease)   ? Hiatal hernia 2010  ? History of MRSA infection 2007  ? History of thyroid nodule 2010  ? Hyperlipemia   ? Lung nodule   ? needs follow up in 05/2013  ? ? ?PAST SURGICAL HISTORY: ?Past Surgical History:  ?Procedure Laterality Date  ? BREAST BIOPSY Right 08/21/2015  ? BREAST EXCISIONAL BIOPSY Left 1994  ? negative  ? CESAREAN SECTION    ? x2  ? COLONOSCOPY    ? TONSILLECTOMY    ? UPPER GASTROINTESTINAL ENDOSCOPY    ? ? ?FAMILY HISTORY: ?Family History  ?Problem Relation Age of Onset  ? Hypertension Mother   ? Kidney disease Brother   ? Irritable bowel syndrome Father   ? Neuropathy Father   ? Breast cancer Other   ? Esophageal cancer Maternal Grandfather   ? Heart disease Maternal Grandmother   ? Colon cancer Neg Hx   ? Rectal cancer Neg Hx   ? Stomach cancer Neg Hx   ? ? ?ADVANCED DIRECTIVES (Y/N):  N ? ?HEALTH MAINTENANCE: ?Social History  ? ?Tobacco Use  ? Smoking status: Former  ?  Years: 5.00  ?  Types: Cigarettes  ?  Quit date: 08/17/1977  ?  Years since quitting: 44.3  ? Smokeless tobacco: Never  ? Tobacco comments:  ?  as  a teenager  ?Vaping Use  ? Vaping Use: Never used  ?Substance Use Topics  ? Alcohol use: Yes  ?  Alcohol/week: 0.0 standard drinks  ?  Comment: occ. wine or beer  ? Drug use: No  ? ? ? Colonoscopy: ? PAP: ? Bone density: ? Lipid panel: ? ?No Known Allergies ? ?Current Outpatient Medications  ?Medication Sig Dispense Refill  ? B Complex-C-Folic Acid (DEXIFOL) 5 MG TABS Take by mouth.    ? DULoxetine (CYMBALTA) 60 MG capsule Take 1qd 90 capsule 3  ? esomeprazole (NEXIUM) 40  MG capsule Take 1 capsule (40 mg total) by mouth daily before breakfast. 90 capsule 3  ? Estradiol 10 MCG TABS vaginal tablet INSERT 1 TABLET INTRAVAGINALLY 2 TIMES A WEEK    ? famotidine (PEPCID) 20 MG tablet Take 1 tablet (20 mg total) by mouth 2 (two) times daily. 180 tablet 3  ? fexofenadine (ALLEGRA ALLERGY) 60 MG tablet Take 1 tablet (60 mg total) by mouth daily. 30 tablet 3  ? folic acid (FOLVITE) 1 MG tablet Take by mouth.    ? Multiple Vitamin (MULTIVITAMIN) tablet Take 1 tablet by mouth daily.    ? OVER THE COUNTER MEDICATION Calcium and vitamin D. One capsule daily.    ? rosuvastatin (CRESTOR) 5 MG tablet Take 1 tablet (5 mg total) by mouth daily. 90 tablet 3  ? acetaminophen (TYLENOL) 500 MG tablet Take 500 mg by mouth every 6 (six) hours as needed. (Patient not taking: Reported on 10/30/2021)    ? fluticasone (FLONASE) 50 MCG/ACT nasal spray Place 2 sprays into both nostrils daily. 16 g 6  ? OVER THE COUNTER MEDICATION Black Elderberry 575 mg  2 capsules daily.    ? OVER THE COUNTER MEDICATION B complex vitamins one tablet daily. (Patient not taking: Reported on 10/30/2021)    ? OVER THE COUNTER MEDICATION Probiotic,  One capsule daily.    ? ?No current facility-administered medications for this visit.  ? ? ?OBJECTIVE: ?There were no vitals filed for this visit. ?   There is no height or weight on file to calculate BMI.    ECOG FS:0 - Asymptomatic ? ?General: Well-developed, well-nourished, no acute distress. ?HEENT: Normocephalic. ?Neuro: Alert, answering all questions appropriately. Cranial nerves grossly intact. ?Psych: Normal affect. ? ?LAB RESULTS: ? ?Lab Results  ?Component Value Date  ? NA 138 10/30/2021  ? K 4.8 10/30/2021  ? CL 102 10/30/2021  ? CO2 28 10/30/2021  ? GLUCOSE 89 10/30/2021  ? BUN 18 10/30/2021  ? CREATININE 0.82 10/30/2021  ? CALCIUM 9.5 10/30/2021  ? PROT 7.3 10/30/2021  ? ALBUMIN 4.2 10/30/2021  ? AST 22 10/30/2021  ? ALT 21 10/30/2021  ? ALKPHOS 43 10/30/2021  ? BILITOT 0.3  10/30/2021  ? GFRNONAA >60 10/30/2021  ? ? ?Lab Results  ?Component Value Date  ? WBC 5.8 10/30/2021  ? NEUTROABS 3.9 10/30/2021  ? HGB 14.5 10/30/2021  ? HCT 42.9 10/30/2021  ? MCV 92.7 10/30/2021  ? PLT 256 10/30/2021  ? ? ? ?STUDIES: ?No results found. ? ?ASSESSMENT: Leukopenia, resolved. ? ?PLAN:   ? ?Leukopenia: Patient's most recent white blood cell count was within normal limits at 5.8.  All of her other laboratory work including peripheral blood flow cytometry and antineutrophil antibodies are either negative or within normal limits.  No intervention is needed at this time.  Patient does not require bone marrow biopsy.  No follow-up has been scheduled.  Please refer patient back if there are  any questions or concerns.   ? ?I provided 20 minutes of face-to-face video visit time during this encounter which included chart review, counseling, and coordination of care as documented above. ? ? ?Patient expressed understanding and was in agreement with this plan. She also understands that She can call clinic at any time with any questions, concerns, or complaints.  ? ? ?Lloyd Huger, MD   11/24/2021 10:40 AM ? ? ? ? ?

## 2021-11-17 DIAGNOSIS — R69 Illness, unspecified: Secondary | ICD-10-CM | POA: Diagnosis not present

## 2021-11-17 DIAGNOSIS — K219 Gastro-esophageal reflux disease without esophagitis: Secondary | ICD-10-CM | POA: Diagnosis not present

## 2021-11-17 DIAGNOSIS — E785 Hyperlipidemia, unspecified: Secondary | ICD-10-CM | POA: Diagnosis not present

## 2021-11-20 ENCOUNTER — Inpatient Hospital Stay: Payer: 59 | Attending: Oncology | Admitting: Oncology

## 2021-11-20 DIAGNOSIS — D72819 Decreased white blood cell count, unspecified: Secondary | ICD-10-CM | POA: Diagnosis not present

## 2022-01-19 DIAGNOSIS — Z01419 Encounter for gynecological examination (general) (routine) without abnormal findings: Secondary | ICD-10-CM | POA: Diagnosis not present

## 2022-01-19 DIAGNOSIS — R87610 Atypical squamous cells of undetermined significance on cytologic smear of cervix (ASC-US): Secondary | ICD-10-CM | POA: Diagnosis not present

## 2022-01-19 DIAGNOSIS — Z6829 Body mass index (BMI) 29.0-29.9, adult: Secondary | ICD-10-CM | POA: Diagnosis not present

## 2022-01-19 DIAGNOSIS — Z1231 Encounter for screening mammogram for malignant neoplasm of breast: Secondary | ICD-10-CM | POA: Diagnosis not present

## 2022-01-19 DIAGNOSIS — Z124 Encounter for screening for malignant neoplasm of cervix: Secondary | ICD-10-CM | POA: Diagnosis not present

## 2022-07-21 DIAGNOSIS — Z86018 Personal history of other benign neoplasm: Secondary | ICD-10-CM | POA: Diagnosis not present

## 2022-07-21 DIAGNOSIS — D225 Melanocytic nevi of trunk: Secondary | ICD-10-CM | POA: Diagnosis not present

## 2022-07-21 DIAGNOSIS — L570 Actinic keratosis: Secondary | ICD-10-CM | POA: Diagnosis not present

## 2022-07-21 DIAGNOSIS — L578 Other skin changes due to chronic exposure to nonionizing radiation: Secondary | ICD-10-CM | POA: Diagnosis not present

## 2022-07-21 DIAGNOSIS — Z09 Encounter for follow-up examination after completed treatment for conditions other than malignant neoplasm: Secondary | ICD-10-CM | POA: Diagnosis not present

## 2022-09-03 DIAGNOSIS — E785 Hyperlipidemia, unspecified: Secondary | ICD-10-CM | POA: Diagnosis not present

## 2022-09-03 DIAGNOSIS — F419 Anxiety disorder, unspecified: Secondary | ICD-10-CM | POA: Diagnosis not present

## 2022-09-03 DIAGNOSIS — F411 Generalized anxiety disorder: Secondary | ICD-10-CM | POA: Diagnosis not present

## 2022-09-03 DIAGNOSIS — K219 Gastro-esophageal reflux disease without esophagitis: Secondary | ICD-10-CM | POA: Diagnosis not present

## 2022-09-03 DIAGNOSIS — D703 Neutropenia due to infection: Secondary | ICD-10-CM | POA: Diagnosis not present

## 2022-09-03 DIAGNOSIS — E559 Vitamin D deficiency, unspecified: Secondary | ICD-10-CM | POA: Diagnosis not present

## 2022-10-13 DIAGNOSIS — M13862 Other specified arthritis, left knee: Secondary | ICD-10-CM | POA: Diagnosis not present

## 2022-11-26 DIAGNOSIS — E785 Hyperlipidemia, unspecified: Secondary | ICD-10-CM | POA: Diagnosis not present

## 2022-11-26 DIAGNOSIS — E538 Deficiency of other specified B group vitamins: Secondary | ICD-10-CM | POA: Diagnosis not present

## 2022-11-26 DIAGNOSIS — E559 Vitamin D deficiency, unspecified: Secondary | ICD-10-CM | POA: Diagnosis not present

## 2022-12-07 DIAGNOSIS — K219 Gastro-esophageal reflux disease without esophagitis: Secondary | ICD-10-CM | POA: Diagnosis not present

## 2022-12-07 DIAGNOSIS — F411 Generalized anxiety disorder: Secondary | ICD-10-CM | POA: Diagnosis not present

## 2022-12-07 DIAGNOSIS — Z0000 Encounter for general adult medical examination without abnormal findings: Secondary | ICD-10-CM | POA: Diagnosis not present

## 2022-12-07 DIAGNOSIS — E785 Hyperlipidemia, unspecified: Secondary | ICD-10-CM | POA: Diagnosis not present

## 2022-12-07 DIAGNOSIS — Z1211 Encounter for screening for malignant neoplasm of colon: Secondary | ICD-10-CM | POA: Diagnosis not present

## 2022-12-30 DIAGNOSIS — M1712 Unilateral primary osteoarthritis, left knee: Secondary | ICD-10-CM | POA: Diagnosis not present

## 2023-01-25 DIAGNOSIS — Z124 Encounter for screening for malignant neoplasm of cervix: Secondary | ICD-10-CM | POA: Diagnosis not present

## 2023-01-25 DIAGNOSIS — Z1231 Encounter for screening mammogram for malignant neoplasm of breast: Secondary | ICD-10-CM | POA: Diagnosis not present

## 2023-01-25 DIAGNOSIS — Z01419 Encounter for gynecological examination (general) (routine) without abnormal findings: Secondary | ICD-10-CM | POA: Diagnosis not present

## 2023-04-22 DIAGNOSIS — M85851 Other specified disorders of bone density and structure, right thigh: Secondary | ICD-10-CM | POA: Diagnosis not present

## 2023-04-22 DIAGNOSIS — M8589 Other specified disorders of bone density and structure, multiple sites: Secondary | ICD-10-CM | POA: Diagnosis not present

## 2023-05-04 DIAGNOSIS — M8580 Other specified disorders of bone density and structure, unspecified site: Secondary | ICD-10-CM | POA: Diagnosis not present

## 2023-05-08 ENCOUNTER — Ambulatory Visit
Admission: EM | Admit: 2023-05-08 | Discharge: 2023-05-08 | Disposition: A | Discharge status: Home / Self Care | Payer: Medicare HMO | Attending: Emergency Medicine | Admitting: Emergency Medicine

## 2023-05-08 DIAGNOSIS — R051 Acute cough: Secondary | ICD-10-CM

## 2023-05-08 DIAGNOSIS — B349 Viral infection, unspecified: Secondary | ICD-10-CM | POA: Insufficient documentation

## 2023-05-08 DIAGNOSIS — Z20822 Contact with and (suspected) exposure to covid-19: Secondary | ICD-10-CM | POA: Diagnosis not present

## 2023-05-08 NOTE — ED Provider Notes (Signed)
[facility]    CSN: [id number] Arrival date & time: 05/08/23  1230      History   Chief Complaint Chief Complaint  Patient presents with   Covid Exposure    HPI Monica Carrillo is a 67 y.o. female.  Patient presents with 1 day history of bodyaches, fatigue, headache, sore throat, cough.  She has been taking Tylenol.  No fever, shortness of breath, chest pain, vomiting, diarrhea, or other symptoms.  Her husband has COVID.     The history is provided by the patient, the spouse and medical records.    Past Medical History:  Diagnosis Date   Anxiety    Arthritis    Gallstones    GERD (gastroesophageal reflux disease)    Hiatal hernia 2010   History of MRSA infection 2007   History of thyroid nodule 2010   Hyperlipemia    Lung nodule    needs follow up in 05/2013    Patient Active Problem List   Diagnosis Date Noted   Leukopenia 10/24/2021   Chronic headaches 06/14/2019   History of MRSA infection 09/27/2017   Plantar fasciitis, bilateral 05/24/2017   HLD (hyperlipidemia) 01/17/2013   Vitamin D deficiency 10/03/2012   Anxiety    Gallstones    GERD (gastroesophageal reflux disease)    Lung nodule     Past Surgical History:  Procedure Laterality Date   BREAST BIOPSY Right 08/21/2015   BREAST EXCISIONAL BIOPSY Left 1994   negative   CESAREAN SECTION     x2   COLONOSCOPY     TONSILLECTOMY     UPPER GASTROINTESTINAL ENDOSCOPY      OB History   No obstetric history on file.      Home Medications    Prior to Admission medications   Medication Sig Start Date End Date Taking? Authorizing Provider  acetaminophen (TYLENOL) 500 MG tablet Take 500 mg by mouth every 6 (six) hours as needed. Patient not taking: Reported on 10/30/2021    [provider]  B Complex-C-Folic Acid (DEXIFOL) 5 MG TABS Take by mouth.    [provider]  DULoxetine (CYMBALTA) 30 MG capsule Take 30 mg by mouth daily.    [provider]  DULoxetine  (CYMBALTA) 60 MG capsule Take 1qd 04/19/19   Dianne Dun, MD  esomeprazole (NEXIUM) 40 MG capsule Take 1 capsule (40 mg total) by mouth daily before breakfast. 04/19/19   Dianne Dun, MD  Estradiol 10 MCG TABS vaginal tablet INSERT 1 TABLET INTRAVAGINALLY 2 TIMES A WEEK 01/22/21   [provider]  famotidine (PEPCID) 20 MG tablet Take 1 tablet (20 mg total) by mouth 2 (two) times daily. 04/19/19   Dianne Dun, MD  fexofenadine [facility] ALLERGY) 60 MG tablet Take 1 tablet (60 mg total) by mouth daily. 12/29/17   Dianne Dun, MD  fluticasone (FLONASE) 50 MCG/ACT nasal spray Place 2 sprays into both nostrils daily. 06/14/19   Dianne Dun, MD  folic acid (FOLVITE) 1 MG tablet Take by mouth.    [provider]  Multiple Vitamin (MULTIVITAMIN) tablet Take 1 tablet by mouth daily.    [provider]  OVER THE COUNTER MEDICATION Black Elderberry 575 mg  2 capsules daily.    [provider]  OVER THE COUNTER MEDICATION B complex vitamins one tablet daily. Patient not taking: Reported on 10/30/2021    [provider]  OVER THE COUNTER MEDICATION Probiotic,  One capsule daily.    [provider]  OVER THE COUNTER MEDICATION Calcium and vitamin D. One capsule daily.    [provider]  rosuvastatin (CRESTOR) 5 MG tablet Take 1 tablet (5 mg total) by mouth daily. 04/19/19   Dianne Dun, MD    Family History Family History  Problem Relation Age of Onset   Hypertension Mother    Kidney disease Brother    Irritable bowel syndrome Father    Neuropathy Father    Breast cancer Other    Esophageal cancer Maternal Grandfather    Heart disease Maternal Grandmother    Colon cancer Neg Hx    Rectal cancer Neg Hx    Stomach cancer Neg Hx     Social History Social History   Tobacco Use   Smoking status: Former    Current packs/day: 0.00    Types: Cigarettes    Start date: 08/17/1972    Quit date: 08/17/1977    Years since quitting: 45.7    Smokeless tobacco: Never   Tobacco comments:    as a teenager  Vaping Use   Vaping status: Never Used  Substance Use Topics   Alcohol use: Yes    Alcohol/week: 0.0 standard drinks of alcohol    Comment: occ. wine or beer   Drug use: No     Allergies   Patient has no known allergies.   Review of Systems Review of Systems  Constitutional:  Positive for fatigue. Negative for chills and fever.  HENT:  Positive for sore throat. Negative for ear pain.   Respiratory:  Positive for cough. Negative for shortness of breath.   Cardiovascular:  Negative for chest pain and palpitations.  Gastrointestinal:  Negative for diarrhea and vomiting.  Neurological:  Positive for headaches.     Physical Exam Triage Vital Signs ED Triage Vitals  Encounter Vitals Group     BP      Systolic BP Percentile      Diastolic BP Percentile      Pulse      Resp      Temp      Temp src      SpO2      Weight      Height      Head Circumference      Peak Flow      Pain Score      Pain Loc      Pain Education      Exclude from Growth Chart    No data found.  Updated Vital Signs BP 122/80   Pulse 93   Temp 99.2 F (37.3 C)   Resp 18   SpO2 95%   Visual Acuity Right Eye Distance:   Left Eye Distance:   Bilateral Distance:    Right Eye Near:   Left Eye Near:    Bilateral Near:     Physical Exam Constitutional:      General: She is not in acute distress.    Appearance: She is ill-appearing.  HENT:     Right Ear: Tympanic membrane normal.     Left Ear: Tympanic membrane normal.     Nose: Nose normal.     Mouth/Throat:     Mouth: Mucous membranes are moist.     Pharynx: Oropharynx is clear.  Cardiovascular:     Rate and Rhythm: Normal rate and regular rhythm.     Heart sounds: Normal heart sounds.  Pulmonary:     Effort: Pulmonary effort is normal. No respiratory distress.  Breath sounds: Normal breath sounds.  Skin:    General: Skin is warm and dry.  Neurological:      Mental Status: She is alert.  Psychiatric:        Mood and Affect: Mood normal.        Behavior: Behavior normal.      UC Treatments / Results  Labs (all labs ordered are listed, but only abnormal results are displayed) Labs Reviewed  SARS CORONAVIRUS 2 (TAT 6-24 HRS)    EKG   Radiology No results found.  Procedures Procedures (including critical care time)  Medications Ordered in UC Medications - No data to display  Initial Impression / Assessment and Plan / UC Course  I have reviewed the triage vital signs and the nursing notes.  Pertinent labs & imaging results that were available during my care of the patient were reviewed by me and considered in my medical decision making (see chart for details).   Cough, viral illness, exposure to COVID.  COVID pending.  If COVID positive, recommend treatment with Paxlovid.  Last GFR 63 on 11/26/2022.  She will need to pause her statin.  Patient will consider Paxlovid versus molnupiravir while she awaits her test result.  She will call here in the morning for follow-up with her decision if her test is positive.  Discussed symptomatic treatment including Tylenol, rest, hydration.  Education provided on COVID.  Instructed her to follow-up with her PCP if she is not improving.  ED precautions given.  She agrees to plan of care.  Final Clinical Impressions(s) / UC Diagnoses   Final diagnoses:  Acute cough  Exposure to COVID-19 virus  Viral illness     Discharge Instructions      Your COVID test is pending.    Take Tylenol as needed for fever or discomfort.  Rest and keep yourself hydrated.    Follow-up with your primary care provider if your symptoms are not improving.         ED Prescriptions   None    PDMP not reviewed this encounter.   Mickie Bail, NP 05/08/23 1320

## 2023-05-08 NOTE — ED Triage Notes (Signed)
Patient to Urgent Care with complaints of  generalized body aches/ cough/ neck and throat pain. Exposure to Covid via husband.  Reports symptoms started last night.

## 2023-05-08 NOTE — Discharge Instructions (Signed)
Your COVID test is pending.    Take Tylenol as needed for fever or discomfort.  Rest and keep yourself hydrated.    Follow-up with your primary care provider if your symptoms are not improving.

## 2023-05-09 ENCOUNTER — Telehealth: Payer: Self-pay | Admitting: Emergency Medicine

## 2023-05-09 LAB — SARS CORONAVIRUS 2 (TAT 6-24 HRS): SARS Coronavirus 2: POSITIVE — AB

## 2023-05-09 MED ORDER — MOLNUPIRAVIR EUA 200MG CAPSULE: 4.0000 | ORAL_CAPSULE | Freq: Two times a day (BID) | ORAL | 0 refills | Status: AC

## 2023-05-09 NOTE — Telephone Encounter (Signed)
Patient called to the urgent care follow-up on her positive COVID test result.  She has considered Paxlovid and molnupiravir.  She declines Paxlovid.  She would like molnupiravir sent to her pharmacy and she may decide to take this.  Discussed that the medication needs to be started within the first 5 days of symptoms.  Discussed that molnupiravir is an emergency authorized medication and side effects of molnupiravir.  Also discussed with patient that she should consider reaching out to her PCP to discuss the COVID medications and see what they suggest.  Patient states her symptoms have improved after a good night sleep.  She will continue symptomatic care and agrees to plan of care.

## 2023-06-16 DIAGNOSIS — K219 Gastro-esophageal reflux disease without esophagitis: Secondary | ICD-10-CM | POA: Diagnosis not present

## 2023-06-29 ENCOUNTER — Encounter: Payer: Self-pay | Admitting: Internal Medicine

## 2023-07-22 DIAGNOSIS — Z872 Personal history of diseases of the skin and subcutaneous tissue: Secondary | ICD-10-CM | POA: Diagnosis not present

## 2023-07-22 DIAGNOSIS — Z86018 Personal history of other benign neoplasm: Secondary | ICD-10-CM | POA: Diagnosis not present

## 2023-07-22 DIAGNOSIS — L578 Other skin changes due to chronic exposure to nonionizing radiation: Secondary | ICD-10-CM | POA: Diagnosis not present

## 2023-07-22 DIAGNOSIS — D492 Neoplasm of unspecified behavior of bone, soft tissue, and skin: Secondary | ICD-10-CM | POA: Diagnosis not present

## 2023-08-05 DIAGNOSIS — H35362 Drusen (degenerative) of macula, left eye: Secondary | ICD-10-CM | POA: Diagnosis not present

## 2023-08-05 DIAGNOSIS — H2513 Age-related nuclear cataract, bilateral: Secondary | ICD-10-CM | POA: Diagnosis not present

## 2023-08-05 DIAGNOSIS — Z83518 Family history of other specified eye disorder: Secondary | ICD-10-CM | POA: Diagnosis not present

## 2023-08-06 ENCOUNTER — Ambulatory Visit (AMBULATORY_SURGERY_CENTER): Payer: Medicare HMO

## 2023-08-06 VITALS — Ht 66.0 in | Wt 190.0 lb

## 2023-08-06 DIAGNOSIS — Z860100 Personal history of colon polyps, unspecified: Secondary | ICD-10-CM

## 2023-08-06 MED ORDER — NA SULFATE-K SULFATE-MG SULF 17.5-3.13-1.6 GM/177ML PO SOLN
1.0000 | Freq: Once | ORAL | 0 refills | Status: AC
Start: 2023-08-06 — End: 2023-08-06

## 2023-08-06 NOTE — Progress Notes (Signed)

## 2023-08-20 DIAGNOSIS — H169 Unspecified keratitis: Secondary | ICD-10-CM | POA: Diagnosis not present

## 2023-09-01 ENCOUNTER — Encounter: Payer: Self-pay | Admitting: Certified Registered Nurse Anesthetist

## 2023-09-06 ENCOUNTER — Encounter: Payer: Self-pay | Admitting: Internal Medicine

## 2023-09-08 ENCOUNTER — Encounter: Payer: Self-pay | Admitting: Internal Medicine

## 2023-09-08 ENCOUNTER — Ambulatory Visit: Payer: Medicare HMO | Admitting: Internal Medicine

## 2023-09-08 VITALS — BP 139/82 | HR 61 | Temp 97.5°F | Resp 12 | Ht 66.0 in | Wt 190.0 lb

## 2023-09-08 DIAGNOSIS — E785 Hyperlipidemia, unspecified: Secondary | ICD-10-CM | POA: Diagnosis not present

## 2023-09-08 DIAGNOSIS — Z1211 Encounter for screening for malignant neoplasm of colon: Secondary | ICD-10-CM

## 2023-09-08 DIAGNOSIS — D120 Benign neoplasm of cecum: Secondary | ICD-10-CM

## 2023-09-08 DIAGNOSIS — D122 Benign neoplasm of ascending colon: Secondary | ICD-10-CM

## 2023-09-08 DIAGNOSIS — K635 Polyp of colon: Secondary | ICD-10-CM | POA: Diagnosis not present

## 2023-09-08 DIAGNOSIS — F419 Anxiety disorder, unspecified: Secondary | ICD-10-CM | POA: Diagnosis not present

## 2023-09-08 DIAGNOSIS — Z860100 Personal history of colon polyps, unspecified: Secondary | ICD-10-CM | POA: Diagnosis not present

## 2023-09-08 MED ORDER — SODIUM CHLORIDE 0.9 % IV SOLN: 500.0000 mL | Freq: Once | INTRAVENOUS | Status: DC

## 2023-09-08 NOTE — Progress Notes (Signed)
Pt's states no medical or surgical changes since previsit or office visit. 

## 2023-09-08 NOTE — Progress Notes (Unsigned)
Called to room to assist during endoscopic procedure.  Patient ID and intended procedure confirmed with present staff. Received instructions for my participation in the procedure from the performing physician.  

## 2023-09-08 NOTE — Progress Notes (Unsigned)
GASTROENTEROLOGY PROCEDURE H&P NOTE   Primary Care Physician: Enid Baas, MD    Reason for Procedure:  History of colonic polyps  Plan:    Colonoscopy  Patient is appropriate for endoscopic procedure(s) in the ambulatory ([facility]) setting.  The nature of the procedure, as well as the risks, benefits, and alternatives were carefully and thoroughly reviewed with the patient. Ample time for discussion and questions allowed. The patient understood, was satisfied, and agreed to proceed.     HPI: Monica Carrillo is a 68 y.o. female who presents for surveillance colonoscopy.  Medical history as below.  Tolerated the prep.  No recent chest pain or shortness of breath.  No abdominal pain today.  Past Medical History:  Diagnosis Date   Anxiety    Arthritis    Gallstones    GERD (gastroesophageal reflux disease)    History of MRSA infection 2007   History of thyroid nodule 2010   Hyperlipemia    Lung nodule    needs follow up in 05/2013    Past Surgical History:  Procedure Laterality Date   BREAST BIOPSY Right 08/21/2015   BREAST EXCISIONAL BIOPSY Left 1994   negative   CESAREAN SECTION     x2   COLONOSCOPY     TONSILLECTOMY     UPPER GASTROINTESTINAL ENDOSCOPY      Prior to Admission medications   Medication Sig Start Date End Date Taking? Authorizing Provider  Bacillus Coagulans-Inulin (PROBIOTIC-PREBIOTIC) 1-250 BILLION-MG CAPS  01/21/23  Yes [provider]  DULoxetine (CYMBALTA) 30 MG capsule Take 30 mg by mouth daily.   Yes [provider]  DULoxetine (CYMBALTA) 60 MG capsule Take 1qd 04/19/19  Yes Dianne Dun, MD  Estradiol 10 MCG TABS vaginal tablet INSERT 1 TABLET INTRAVAGINALLY 2 TIMES A WEEK 01/22/21  Yes [provider]  famotidine (PEPCID) 20 MG tablet Take 1 tablet (20 mg total) by mouth 2 (two) times daily. 04/19/19  Yes Dianne Dun, MD  fexofenadine [facility] ALLERGY) 60 MG tablet Take 1 tablet (60 mg total) by mouth daily.  12/29/17  Yes Dianne Dun, MD  folic acid (FOLVITE) 1 MG tablet Take by mouth.   Yes [provider]  Multiple Vitamin (MULTIVITAMIN) tablet Take 1 tablet by mouth daily.   Yes [provider]  OVER THE COUNTER MEDICATION Calcium and vitamin D. One capsule daily.   Yes [provider]  RABEprazole (ACIPHEX) 20 MG tablet Take by mouth. 06/16/23 06/15/24 Yes [provider]  rosuvastatin (CRESTOR) 5 MG tablet Take 1 tablet (5 mg total) by mouth daily. 04/19/19  Yes Dianne Dun, MD  TURMERIC PO Take by mouth.   Yes [provider]  zinc gluconate 3.75 mg/mL SOLN Take by mouth.   Yes [provider]    Current Outpatient Medications  Medication Sig Dispense Refill   Bacillus Coagulans-Inulin (PROBIOTIC-PREBIOTIC) 1-250 BILLION-MG CAPS      DULoxetine (CYMBALTA) 30 MG capsule Take 30 mg by mouth daily.     DULoxetine (CYMBALTA) 60 MG capsule Take 1qd 90 capsule 3   Estradiol 10 MCG TABS vaginal tablet INSERT 1 TABLET INTRAVAGINALLY 2 TIMES A WEEK     famotidine (PEPCID) 20 MG tablet Take 1 tablet (20 mg total) by mouth 2 (two) times daily. 180 tablet 3   fexofenadine (ALLEGRA ALLERGY) 60 MG tablet Take 1 tablet (60 mg total) by mouth daily. 30 tablet 3   folic acid (FOLVITE) 1 MG tablet Take by mouth.  Multiple Vitamin (MULTIVITAMIN) tablet Take 1 tablet by mouth daily.     OVER THE COUNTER MEDICATION Calcium and vitamin D. One capsule daily.     RABEprazole (ACIPHEX) 20 MG tablet Take by mouth.     rosuvastatin (CRESTOR) 5 MG tablet Take 1 tablet (5 mg total) by mouth daily. 90 tablet 3   TURMERIC PO Take by mouth.     zinc gluconate 3.75 mg/mL SOLN Take by mouth.     Current Facility-Administered Medications  Medication Dose Route Frequency Provider Last Rate Last Admin   0.9 %  sodium chloride infusion  500 mL Intravenous Once Burnett Lieber, Carie Caddy, MD        Allergies as of 09/08/2023   (No Known Allergies)    Family History   Problem Relation Age of Onset   Hypertension Mother    Colon polyps Father    Irritable bowel syndrome Father    Neuropathy Father    Kidney disease Brother    Heart disease Maternal Grandmother    Esophageal cancer Maternal Grandfather    Breast cancer Other    Colon cancer Neg Hx    Rectal cancer Neg Hx    Stomach cancer Neg Hx     Social History   Socioeconomic History   Marital status: Married    Spouse name: Not on file   Number of children: 2   Years of education: Not on file   Highest education level: Not on file  Occupational History   Occupation: Retired  Tobacco Use   Smoking status: Former    Current packs/day: 0.00    Types: Cigarettes    Start date: 08/17/1972    Quit date: 08/17/1977    Years since quitting: 46.0   Smokeless tobacco: Never   Tobacco comments:    as a teenager  Vaping Use   Vaping status: Never Used  Substance and Sexual Activity   Alcohol use: Yes    Alcohol/week: 0.0 standard drinks of alcohol    Comment: occ. wine or beer   Drug use: No   Sexual activity: Not on file  Other Topics Concern   Not on file  Social History Narrative   Married.  Two children, 5 grandchildren.   Son, daughter in law and two grand daughters live in [place].      Recently moved to [place] from [place].   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   Social Drivers of Corporate investment banker Strain: Low Risk  (06/16/2023)   Received from [facility]   Overall Financial Resource Strain (CARDIA)    Difficulty of Paying Living Expenses: Not hard at all  Food Insecurity: No Food Insecurity (06/16/2023)   Received from [facility]   Hunger Vital Sign    Worried About Running Out of Food in the Last Year: Never true    Ran Out of Food in the Last Year: Never true  Transportation Needs: No Transportation Needs (06/16/2023)   Received from [facility] - Transportation     In the past 12 months, has lack of transportation kept you from medical appointments or from getting medications?: No    Lack of Transportation (Non-Medical): No  Physical Activity: Not on file  Stress: Not on file  Social Connections: Not on file  Intimate Partner Violence: Not on file    Physical Exam: Vital signs in last 24 hours: @BP  (!) 146/83  Pulse 64   Temp (!) 97.5 F (36.4 C)   Ht 5\' 6"  (1.676 m)   Wt 190 lb (86.2 kg)   SpO2 98%   BMI 30.67 kg/m  GEN: NAD EYE: Sclerae anicteric ENT: MMM CV: Non-tachycardic Pulm: CTA b/l GI: Soft, NT/ND NEURO:  Alert & Oriented x 3   Erick Blinks, MD [facility]  09/08/2023 3:29 PM

## 2023-09-08 NOTE — Progress Notes (Unsigned)
Report given to PACU, vss 

## 2023-09-08 NOTE — Patient Instructions (Signed)

## 2023-09-08 NOTE — Op Note (Signed)
[facility] Patient Name: Monica Carrillo Procedure Date: 09/08/2023 3:23 PM MRN: [id number] Endoscopist: Beverley Fiedler , MD, [id number] Age: 68 Referring MD:  Date of Birth: 08-Mar-1956 Gender: Female Account #: [id number] Procedure:                Colonoscopy Indications:              High risk colon cancer surveillance: Personal                            history of sessile serrated colon polyp (less than                            10 mm in size) with no dysplasia, Last colonoscopy:                            December 2019 (SSP x 2) Medicines:                Monitored Anesthesia Care Procedure:                Pre-Anesthesia Assessment:                           - Prior to the procedure, a History and Physical                            was performed, and patient medications and                            allergies were reviewed. The patient's tolerance of                            previous anesthesia was also reviewed. The risks                            and benefits of the procedure and the sedation                            options and risks were discussed with the patient.                            All questions were answered, and informed consent                            was obtained. Prior Anticoagulants: The patient has                            taken no anticoagulant or antiplatelet agents. ASA                            Grade Assessment: II - A patient with mild systemic                            disease. After reviewing the risks and benefits,  the patient was deemed in satisfactory condition to                            undergo the procedure.                           After obtaining informed consent, the colonoscope                            was passed under direct vision. Throughout the                            procedure, the patient's blood pressure, pulse, and                            oxygen saturations were monitored  continuously. The                            CF [id number] #[id number] was introduced through the anus                            and advanced to the cecum, identified by                            appendiceal orifice and ileocecal valve. The                            colonoscopy was performed without difficulty. The                            patient tolerated the procedure well. The quality                            of the bowel preparation was good. The ileocecal                            valve, appendiceal orifice, and rectum were                            photographed. Scope In: 3:41:24 PM Scope Out: 3:59:36 PM Scope Withdrawal Time: 0 hours 11 minutes 59 seconds  Total Procedure Duration: 0 hours 18 minutes 12 seconds  Findings:                 The digital rectal exam was normal.                           Two sessile polyps were found in the ascending                            colon and cecum. The polyps were 2 to 4 mm in size.                            These polyps were removed with a cold snare.  Resection and retrieval were complete.                           The exam was otherwise without abnormality on                            direct and retroflexion views. Complications:            No immediate complications. Estimated Blood Loss:     Estimated blood loss: none. Impression:               - Two 2 to 4 mm polyps in the ascending colon and                            in the cecum, removed with a cold snare. Resected                            and retrieved.                           - The examination was otherwise normal on direct                            and retroflexion views. Recommendation:           - Patient has a contact number available for                            emergencies. The signs and symptoms of potential                            delayed complications were discussed with the                            patient. Return to normal  activities tomorrow.                            Written discharge instructions were provided to the                            patient.                           - Resume previous diet.                           - Continue present medications.                           - Await pathology results.                           - Repeat colonoscopy is recommended for                            surveillance. The colonoscopy date will be  determined after pathology results from today's                            exam become available for review. Beverley Fiedler, MD 09/08/2023 4:01:54 PM This report has been signed electronically.

## 2023-09-09 ENCOUNTER — Telehealth: Payer: Self-pay

## 2023-09-09 NOTE — Telephone Encounter (Signed)
  Follow up Call-     09/08/2023    2:49 PM  Call back number  Post procedure Call Back phone  # [phone]  Permission to leave phone message Yes     Patient questions:  Do you have a fever, pain , or abdominal swelling? No. Pain Score  0 *  Have you tolerated food without any problems? Yes.    Have you been able to return to your normal activities? Yes.    Do you have any questions about your discharge instructions: Diet   No. Medications  No. Follow up visit  No.  Do you have questions or concerns about your Care? No.  Actions: * If pain score is 4 or above: No action needed, pain <4.

## 2023-09-13 LAB — SURGICAL PATHOLOGY

## 2023-09-14 ENCOUNTER — Encounter: Payer: Self-pay | Admitting: Internal Medicine

## 2023-09-21 DIAGNOSIS — J340 Abscess, furuncle and carbuncle of nose: Secondary | ICD-10-CM | POA: Diagnosis not present

## 2023-09-21 DIAGNOSIS — H6123 Impacted cerumen, bilateral: Secondary | ICD-10-CM | POA: Diagnosis not present

## 2023-09-21 DIAGNOSIS — H902 Conductive hearing loss, unspecified: Secondary | ICD-10-CM | POA: Diagnosis not present

## 2023-11-16 DIAGNOSIS — E559 Vitamin D deficiency, unspecified: Secondary | ICD-10-CM | POA: Diagnosis not present

## 2023-11-16 DIAGNOSIS — R7303 Prediabetes: Secondary | ICD-10-CM | POA: Diagnosis not present

## 2023-11-16 DIAGNOSIS — K219 Gastro-esophageal reflux disease without esophagitis: Secondary | ICD-10-CM | POA: Diagnosis not present

## 2023-11-16 DIAGNOSIS — F411 Generalized anxiety disorder: Secondary | ICD-10-CM | POA: Diagnosis not present

## 2023-11-16 DIAGNOSIS — Z0000 Encounter for general adult medical examination without abnormal findings: Secondary | ICD-10-CM | POA: Diagnosis not present

## 2023-11-16 DIAGNOSIS — E785 Hyperlipidemia, unspecified: Secondary | ICD-10-CM | POA: Diagnosis not present

## 2023-11-16 DIAGNOSIS — Z79899 Other long term (current) drug therapy: Secondary | ICD-10-CM | POA: Diagnosis not present

## 2023-11-24 DIAGNOSIS — M9903 Segmental and somatic dysfunction of lumbar region: Secondary | ICD-10-CM | POA: Diagnosis not present

## 2023-11-24 DIAGNOSIS — M542 Cervicalgia: Secondary | ICD-10-CM | POA: Diagnosis not present

## 2023-11-24 DIAGNOSIS — M9902 Segmental and somatic dysfunction of thoracic region: Secondary | ICD-10-CM | POA: Diagnosis not present

## 2023-11-24 DIAGNOSIS — M545 Low back pain, unspecified: Secondary | ICD-10-CM | POA: Diagnosis not present

## 2023-11-24 DIAGNOSIS — M62451 Contracture of muscle, right thigh: Secondary | ICD-10-CM | POA: Diagnosis not present

## 2023-11-24 DIAGNOSIS — M170 Bilateral primary osteoarthritis of knee: Secondary | ICD-10-CM | POA: Diagnosis not present

## 2023-11-24 DIAGNOSIS — M62830 Muscle spasm of back: Secondary | ICD-10-CM | POA: Diagnosis not present

## 2023-11-24 DIAGNOSIS — R293 Abnormal posture: Secondary | ICD-10-CM | POA: Diagnosis not present

## 2023-11-24 DIAGNOSIS — M9901 Segmental and somatic dysfunction of cervical region: Secondary | ICD-10-CM | POA: Diagnosis not present

## 2024-02-15 DIAGNOSIS — Z01411 Encounter for gynecological examination (general) (routine) with abnormal findings: Secondary | ICD-10-CM | POA: Diagnosis not present

## 2024-02-15 DIAGNOSIS — N952 Postmenopausal atrophic vaginitis: Secondary | ICD-10-CM | POA: Diagnosis not present

## 2024-02-15 DIAGNOSIS — Z1331 Encounter for screening for depression: Secondary | ICD-10-CM | POA: Diagnosis not present

## 2024-02-15 DIAGNOSIS — Z1231 Encounter for screening mammogram for malignant neoplasm of breast: Secondary | ICD-10-CM | POA: Diagnosis not present

## 2024-02-15 DIAGNOSIS — Z01419 Encounter for gynecological examination (general) (routine) without abnormal findings: Secondary | ICD-10-CM | POA: Diagnosis not present

## 2024-02-15 DIAGNOSIS — Z124 Encounter for screening for malignant neoplasm of cervix: Secondary | ICD-10-CM | POA: Diagnosis not present

## 2024-05-04 ENCOUNTER — Other Ambulatory Visit: Payer: Self-pay | Admitting: Medical Genetics

## 2024-05-23 ENCOUNTER — Other Ambulatory Visit

## 2024-05-23 DIAGNOSIS — Z006 Encounter for examination for normal comparison and control in clinical research program: Secondary | ICD-10-CM

## 2024-05-27 LAB — GENECONNECT MOLECULAR SCREEN

## 2024-05-29 ENCOUNTER — Telehealth: Payer: Self-pay | Admitting: Medical Genetics

## 2024-06-02 ENCOUNTER — Other Ambulatory Visit: Payer: Self-pay | Admitting: Medical Genetics

## 2024-06-02 DIAGNOSIS — Z006 Encounter for examination for normal comparison and control in clinical research program: Secondary | ICD-10-CM

## 2024-06-02 NOTE — Telephone Encounter (Signed)
 [facility]  06/02/2024  10:08 AM  Confirmed I was speaking with Monica Carrillo [phone] by using name and DOB. Informed participant the reason for this call is to follow-up on a recent sample the participant provided at one of the [facility] lab locations. Informed participant the test was not able to be completed with this sample and apologized for the inconvenience. Participant was requested to provide a new sample at one of our participating labs at no cost so that participant can continue participation and receive test results. Informed participant they do not need to be fasting and if there are other samples that need to be drawn, they can be done at the same visit. Participant has not had a blood transfusion or blood product in the last 30 days. Participant agreed to provide another sample. Participant was provided the [employer] program website to learn why this may have happened. Participant was thanked for their time and continued support of the above study.    Jordyn Pennstrom, BS [place]  [facility] Clinical Research Specialist II Direct Dial: [phone]  Fax: [phone]

## 2024-07-24 DIAGNOSIS — Z86018 Personal history of other benign neoplasm: Secondary | ICD-10-CM | POA: Diagnosis not present

## 2024-07-24 DIAGNOSIS — L578 Other skin changes due to chronic exposure to nonionizing radiation: Secondary | ICD-10-CM | POA: Diagnosis not present

## 2024-07-24 DIAGNOSIS — L570 Actinic keratosis: Secondary | ICD-10-CM | POA: Diagnosis not present

## 2024-07-24 DIAGNOSIS — Z872 Personal history of diseases of the skin and subcutaneous tissue: Secondary | ICD-10-CM | POA: Diagnosis not present

## 2024-07-24 DIAGNOSIS — L821 Other seborrheic keratosis: Secondary | ICD-10-CM | POA: Diagnosis not present

## 2024-08-24 ENCOUNTER — Other Ambulatory Visit

## 2024-08-24 DIAGNOSIS — Z006 Encounter for examination for normal comparison and control in clinical research program: Secondary | ICD-10-CM

## 2024-09-01 LAB — GENECONNECT MOLECULAR SCREEN: Genetic Analysis Overall Interpretation: NEGATIVE
